# Patient Record
Sex: Female | Born: 1965 | Race: White | Hispanic: No | Smoking: Former smoker
Health system: Southern US, Community
[De-identification: ages and names within clinical notes are randomized; demographics above are authoritative.]

## PROBLEM LIST (undated history)

## (undated) DIAGNOSIS — B977 Papillomavirus as the cause of diseases classified elsewhere: Secondary | ICD-10-CM

## (undated) DIAGNOSIS — M1990 Unspecified osteoarthritis, unspecified site: Secondary | ICD-10-CM

## (undated) DIAGNOSIS — K589 Irritable bowel syndrome without diarrhea: Secondary | ICD-10-CM

## (undated) DIAGNOSIS — F32A Depression, unspecified: Secondary | ICD-10-CM

## (undated) DIAGNOSIS — F419 Anxiety disorder, unspecified: Secondary | ICD-10-CM

## (undated) DIAGNOSIS — C539 Malignant neoplasm of cervix uteri, unspecified: Secondary | ICD-10-CM

## (undated) DIAGNOSIS — D649 Anemia, unspecified: Secondary | ICD-10-CM

## (undated) DIAGNOSIS — K219 Gastro-esophageal reflux disease without esophagitis: Secondary | ICD-10-CM

## (undated) DIAGNOSIS — F329 Major depressive disorder, single episode, unspecified: Secondary | ICD-10-CM

## (undated) DIAGNOSIS — K602 Anal fissure, unspecified: Secondary | ICD-10-CM

## (undated) HISTORY — PX: INCONTINENCE SURGERY: SHX676

## (undated) HISTORY — DX: Irritable bowel syndrome, unspecified: K58.9

## (undated) HISTORY — PX: HEEL SPUR EXCISION: SHX1733

## (undated) HISTORY — PX: EXCISION MORTON'S NEUROMA: SHX5013

## (undated) HISTORY — PX: FOOT SURGERY: SHX648

## (undated) HISTORY — DX: Papillomavirus as the cause of diseases classified elsewhere: B97.7

## (undated) HISTORY — PX: RHINOPLASTY: SUR1284

## (undated) HISTORY — PX: TAYLOR BUNIONECTOMY: SHX2485

## (undated) HISTORY — PX: WISDOM TOOTH EXTRACTION: SHX21

## (undated) HISTORY — DX: Malignant neoplasm of cervix uteri, unspecified: C53.9

## (undated) HISTORY — PX: INGUINAL HERNIA REPAIR: SUR1180

## (undated) HISTORY — DX: Anal fissure, unspecified: K60.2

## (undated) HISTORY — PX: HERNIA REPAIR: SHX51

---

## 1997-09-08 ENCOUNTER — Other Ambulatory Visit: Admission: RE | Admit: 1997-09-08 | Discharge: 1997-09-08 | Payer: Self-pay | Admitting: *Deleted

## 1998-04-24 ENCOUNTER — Emergency Department (HOSPITAL_COMMUNITY): Admission: EM | Admit: 1998-04-24 | Discharge: 1998-04-24 | Payer: Self-pay | Admitting: Emergency Medicine

## 1998-04-24 ENCOUNTER — Encounter: Payer: Self-pay | Admitting: Emergency Medicine

## 1998-09-27 ENCOUNTER — Other Ambulatory Visit: Admission: RE | Admit: 1998-09-27 | Discharge: 1998-09-27 | Payer: Self-pay | Admitting: Obstetrics and Gynecology

## 1999-10-26 ENCOUNTER — Other Ambulatory Visit: Admission: RE | Admit: 1999-10-26 | Discharge: 1999-10-26 | Payer: Self-pay | Admitting: *Deleted

## 2000-11-04 ENCOUNTER — Other Ambulatory Visit: Admission: RE | Admit: 2000-11-04 | Discharge: 2000-11-04 | Payer: Self-pay | Admitting: Obstetrics and Gynecology

## 2001-07-11 ENCOUNTER — Emergency Department (HOSPITAL_COMMUNITY): Admission: EM | Admit: 2001-07-11 | Discharge: 2001-07-12 | Payer: Self-pay | Admitting: *Deleted

## 2001-07-12 ENCOUNTER — Encounter: Payer: Self-pay | Admitting: *Deleted

## 2002-05-19 ENCOUNTER — Encounter: Admission: RE | Admit: 2002-05-19 | Discharge: 2002-05-19 | Payer: Self-pay | Admitting: Internal Medicine

## 2002-05-19 ENCOUNTER — Encounter: Payer: Self-pay | Admitting: Internal Medicine

## 2002-08-06 ENCOUNTER — Other Ambulatory Visit: Admission: RE | Admit: 2002-08-06 | Discharge: 2002-08-06 | Payer: Self-pay | Admitting: Obstetrics and Gynecology

## 2006-10-03 ENCOUNTER — Emergency Department (HOSPITAL_COMMUNITY): Admission: EM | Admit: 2006-10-03 | Discharge: 2006-10-03 | Payer: Self-pay | Admitting: Emergency Medicine

## 2009-10-28 ENCOUNTER — Ambulatory Visit (HOSPITAL_COMMUNITY): Admission: RE | Admit: 2009-10-28 | Discharge: 2009-10-28 | Payer: Self-pay | Admitting: Obstetrics and Gynecology

## 2010-04-23 HISTORY — PX: ENDOMETRIAL ABLATION: SHX621

## 2010-04-23 HISTORY — PX: OTHER SURGICAL HISTORY: SHX169

## 2010-07-09 LAB — CBC
HCT: 36.1 % (ref 36.0–46.0)
Hemoglobin: 12.5 g/dL (ref 12.0–15.0)
MCH: 31.4 pg (ref 26.0–34.0)
MCHC: 34.7 g/dL (ref 30.0–36.0)
MCV: 90.5 fL (ref 78.0–100.0)
Platelets: 242 10*3/uL (ref 150–400)
RBC: 3.99 MIL/uL (ref 3.87–5.11)
RDW: 12.8 % (ref 11.5–15.5)
WBC: 3.8 10*3/uL — ABNORMAL LOW (ref 4.0–10.5)

## 2010-07-09 LAB — SURGICAL PCR SCREEN
MRSA, PCR: NEGATIVE
Staphylococcus aureus: NEGATIVE

## 2010-07-09 LAB — HCG, SERUM, QUALITATIVE: Preg, Serum: NEGATIVE

## 2011-02-08 LAB — CBC
HCT: 40.5
Platelets: 299
RDW: 12.8
WBC: 4.3

## 2011-02-08 LAB — DIFFERENTIAL
Basophils Absolute: 0
Basophils Relative: 0
Eosinophils Absolute: 0
Eosinophils Relative: 0
Monocytes Absolute: 0.3
Monocytes Relative: 8

## 2011-02-08 LAB — COMPREHENSIVE METABOLIC PANEL
ALT: 16
AST: 23
Albumin: 3.9
Alkaline Phosphatase: 56
Chloride: 98
Potassium: 2.9 — ABNORMAL LOW
Sodium: 131 — ABNORMAL LOW
Total Bilirubin: 1
Total Protein: 7

## 2011-02-08 LAB — URINALYSIS, ROUTINE W REFLEX MICROSCOPIC
Bilirubin Urine: NEGATIVE
Ketones, ur: NEGATIVE
Nitrite: NEGATIVE
Specific Gravity, Urine: 1.015
Urobilinogen, UA: 0.2

## 2011-02-08 LAB — URINE MICROSCOPIC-ADD ON

## 2011-02-08 LAB — POCT PREGNANCY, URINE
Operator id: 265961
Preg Test, Ur: NEGATIVE

## 2011-06-28 ENCOUNTER — Emergency Department (HOSPITAL_COMMUNITY): Payer: BC Managed Care – PPO

## 2011-06-28 ENCOUNTER — Emergency Department (HOSPITAL_COMMUNITY)
Admission: EM | Admit: 2011-06-28 | Discharge: 2011-06-28 | Disposition: A | Discharge status: Home / Self Care | Payer: BC Managed Care – PPO | Attending: Emergency Medicine | Admitting: Emergency Medicine

## 2011-06-28 ENCOUNTER — Encounter (HOSPITAL_COMMUNITY): Payer: Self-pay | Admitting: *Deleted

## 2011-06-28 DIAGNOSIS — M7989 Other specified soft tissue disorders: Secondary | ICD-10-CM | POA: Insufficient documentation

## 2011-06-28 DIAGNOSIS — W230XXA Caught, crushed, jammed, or pinched between moving objects, initial encounter: Secondary | ICD-10-CM | POA: Insufficient documentation

## 2011-06-28 DIAGNOSIS — L03019 Cellulitis of unspecified finger: Secondary | ICD-10-CM | POA: Insufficient documentation

## 2011-06-28 DIAGNOSIS — S6010XA Contusion of unspecified finger with damage to nail, initial encounter: Secondary | ICD-10-CM

## 2011-06-28 DIAGNOSIS — S6990XA Unspecified injury of unspecified wrist, hand and finger(s), initial encounter: Secondary | ICD-10-CM | POA: Insufficient documentation

## 2011-06-28 DIAGNOSIS — M25549 Pain in joints of unspecified hand: Secondary | ICD-10-CM | POA: Insufficient documentation

## 2011-06-28 DIAGNOSIS — S6000XA Contusion of unspecified finger without damage to nail, initial encounter: Secondary | ICD-10-CM | POA: Insufficient documentation

## 2011-06-28 DIAGNOSIS — IMO0002 Reserved for concepts with insufficient information to code with codable children: Secondary | ICD-10-CM

## 2011-06-28 DIAGNOSIS — M79609 Pain in unspecified limb: Secondary | ICD-10-CM | POA: Insufficient documentation

## 2011-06-28 MED ORDER — SULFAMETHOXAZOLE-TRIMETHOPRIM 800-160 MG PO TABS: 1.0000 | ORAL_TABLET | Freq: Two times a day (BID) | ORAL | Status: AC

## 2011-06-28 MED ORDER — HYDROCODONE-ACETAMINOPHEN 5-325 MG PO TABS: 1.0000 | ORAL_TABLET | ORAL | Status: AC | PRN

## 2011-06-28 NOTE — ED Provider Notes (Signed)
History     CSN: [id number]  Arrival date & time 06/28/11  1842   First MD Initiated Contact with Patient 06/28/11 1944      Chief Complaint  Patient presents with  . Hand Pain    (Consider location/radiation/quality/duration/timing/severity/associated sxs/prior treatment) Patient is a 46 y.o. female presenting with hand pain. The history is provided by the patient.  Hand Pain This is a new problem. Episode onset: 2 days ago. The problem occurs constantly. The problem has been gradually worsening. Associated symptoms include arthralgias. Pertinent negatives include no chills, fever, myalgias, nausea, numbness or weakness. The symptoms are aggravated by bending. She has tried NSAIDs and ice for the symptoms. The treatment provided mild relief.   Pt states she was unloading heavy sample boxes weighing 50+ lbs from her car two days ago when the box apparently closed on her bilateral fingertips, injuring the left index, right index, and right middle finger. Has noted drainage to the two nails on the right hand and has had increased bruising and pressure sensation to the tip of the left index finger. Denies numbness, weakness, loss of ROM. Has not tried warm soaks, ice. She went to urgent care today and was instructed to come to the ED for further eval.  History reviewed. No pertinent past medical history.  Past Surgical History  Procedure Date  . Hernia repair   . Foot surgery   . Incontinence surgery   . Rhinoplasty     No family history on file.  History  Substance Use Topics  . Smoking status: Never Smoker   . Smokeless tobacco: Not on file  . Alcohol Use: No    OB History    Grav Para Term Preterm Abortions TAB SAB Ect Mult Living   1         1      Review of Systems  Constitutional: Negative.  Negative for fever and chills.  Gastrointestinal: Negative for nausea.  Musculoskeletal: Positive for arthralgias. Negative for myalgias.  Skin: Positive for color change and  wound.  Neurological: Negative for weakness and numbness.    Allergies  Anti-inflammatory enzyme; Dust mite extract; Molds & smuts; and Pollen extract  Home Medications   Current Outpatient Rx  Name Route Sig Dispense Refill  . ACETAMINOPHEN 500 MG PO TABS Oral Take 1,000 mg by mouth every 6 (six) hours as needed. For pain    . CELECOXIB 200 MG PO CAPS Oral Take 200 mg by mouth daily.    . CHLORZOXAZONE 500 MG PO TABS Oral Take 500 mg by mouth as needed. For muscle pain.    Marland Kitchen RANITIDINE HCL 150 MG PO TABS Oral Take 150 mg by mouth daily.    . SERTRALINE HCL 100 MG PO TABS Oral Take 100 mg by mouth daily.    . TRAZODONE HCL 50 MG PO TABS Oral Take 75 mg by mouth at bedtime.      BP 124/74  Pulse 68  Temp(Src) 98.1 F (36.7 C) (Oral)  Resp 18  SpO2 98%  LMP 05/29/2011  Physical Exam  Nursing note and vitals reviewed. Constitutional: She appears well-developed and well-nourished. No distress.  HENT:  Head: Normocephalic and atraumatic.  Cardiovascular: Normal rate.   Pulmonary/Chest: Effort normal.  Musculoskeletal:       Right hand: She exhibits tenderness and swelling. She exhibits normal range of motion, normal capillary refill, no deformity and no laceration. normal sensation noted. Decreased sensation is not present in the ulnar distribution, is not  present in the medial distribution and is not present in the radial distribution. Normal strength noted. She exhibits no finger abduction, no thumb/finger opposition and no wrist extension trouble.       Left hand: She exhibits tenderness and swelling. She exhibits normal range of motion, normal capillary refill, no deformity and no laceration. normal sensation noted. Decreased sensation is not present in the ulnar distribution, is not present in the medial redistribution and is not present in the radial distribution. Normal strength noted. She exhibits no finger abduction and no thumb/finger opposition.       Hands:      L index:  subungual hematoma covering >75% of nailbed, dark purple to black in color. Surrounding edema. Nail appears to remain adhered to bed at this time. No evidence for open fx. 5/5 strength on finger flex/ext. Neurovascularly intact with sensory intact to lt touch.  R index and middle: No subungual hematoma but there appears to be serosanguinous drainage from nail bed. Questionable early paronychias to both fingers. Nail appears to remain adhered to bed at this time. No evidence for open fx. 5/5 strength on finger flex/ext. Neurovascularly intact with sensory intact to lt touch.  Neurological: She is alert.  Skin: Skin is warm and dry. She is not diaphoretic.  Psychiatric: She has a normal mood and affect.    ED Course  Procedures (including critical care time)  Labs Reviewed - No data to display Dg Hand Complete Left  06/28/2011  *RADIOLOGY REPORT*  Clinical Data: Bruising and drainage of second finger following injury 2 days ago.  LEFT HAND - COMPLETE 3+ VIEW  Comparison: None.  Findings: The mineralization and alignment are normal.  There is no evidence of acute fracture or dislocation.  There may be mild swelling of the index finger.  No foreign body or soft tissue emphysema is evident.  There is no evidence of bone destruction.  IMPRESSION: No acute osseous findings or foreign body.  Original Report Authenticated By: Gerrianne Scale, M.D.   Dg Hand Complete Right  06/28/2011  *RADIOLOGY REPORT*  Clinical Data: Right hand injury 2 days ago with bruising and drainage from the second and third fingers.  RIGHT HAND - COMPLETE 3+ VIEW  Comparison: None.  Findings: The mineralization and alignment are normal.  There is no evidence of acute fracture or dislocation.  There may be mild soft tissue swelling in the index finger.  No foreign body or soft tissue emphysema is identified.  There is no evidence of bone destruction.  IMPRESSION: No acute osseous findings or foreign bodies demonstrated.  Original  Report Authenticated By: Gerrianne Scale, M.D.     1. Paronychia   2. Subungual hematoma, fingernail   3. Nail bed injury       MDM  Pt presents 2 days after her fingertips were crushed by a heavy box. Subungual hematoma to L index; given timing do not feel that attempting drainage would be beneficial at this time due to clotting. Given drainage to R index, middle fingers will cover with abx for ?paronychia - due to pt's MRSA hx, will cover with Bactrim. Pt encouraged to use hot soaks several times daily. Return precautions discussed.        Grant Fontana, [place] 06/29/11 [id number]

## 2011-06-28 NOTE — Discharge Instructions (Signed)
Your x-ray did not show any fractures. It is possible that the the injured nails may fall off. If this occurs, please keep the areas covered with a bandage to protect the nail bed. Use warm soaks with Epsom salts to the areas several times daily. Use the pain medicine as needed. Return to the ED for worsening condition.

## 2011-06-28 NOTE — ED Notes (Signed)
Pt reports her finger tips were closed in a 50lb sample bags two days ago.  Pt now reports pain to right pointer finger and middle finger. Pt also reports pain to left pointer finger. Pt reports pus coming out of fingertips on right side.  Pt left finger tip is swollen and nailbed is bruised.

## 2011-06-30 NOTE — ED Provider Notes (Signed)
Medical screening examination/treatment/procedure(s) were conducted as a shared visit with non-physician practitioner(s) and myself.  I personally evaluated the patient during the encounter  One fingertip has complete subungual hematoma with mild erythema/tender cuticle without fluctuance question early paronychia forming.  Hurman Horn, MD 06/30/11 2001

## 2011-09-25 ENCOUNTER — Encounter (HOSPITAL_COMMUNITY): Payer: Self-pay | Admitting: *Deleted

## 2011-10-15 ENCOUNTER — Encounter (HOSPITAL_COMMUNITY): Payer: Self-pay | Admitting: Pharmacy Technician

## 2011-10-26 ENCOUNTER — Other Ambulatory Visit: Payer: Self-pay | Admitting: Obstetrics and Gynecology

## 2011-10-28 NOTE — H&P (Signed)
NAMEMIDA, CORY             ACCOUNT NO.:  [id number]  MEDICAL RECORD NO.:  [id number]  LOCATION:  [facility]                         FACILITY:  [facility]  PHYSICIAN:  Lenoard Aden, M.D.DATE OF BIRTH:  [birth year]  DATE OF ADMISSION:  09/24/2011 DATE OF DISCHARGE:                             HISTORY & PHYSICAL   CHIEF COMPLAINT:  Refractory menorrhagia with secondary anemia.  HISTORY OF PRESENT ILLNESS:  She is a 46 year old white female, G2, P1, with aforementioned complaint, for a NovaSure endometrial ablation.  MEDICATIONS:  Celebrex, Zoloft, trazodone as needed, Allegra as needed, and Parafon Forte.  ALLERGIES:  She has allergies to environmental __________.  She denies domestic or physical violence.  FAMILY HISTORY:  Hypertension, skin cancer, diabetes, history of __________ x1, history of anterior colporrhaphy, history of TVT, history of liposuction, cryotherapy, rhinoplasty and bone spur removal.  PHYSICAL EXAMINATION:  GENERAL:  This is a well-developed, well- nourished white female in no acute distress. HEENT:  Normal. NECK:  Supple.  Full range of motion [QAMARKER ABDOMENS:  Soft __________nontender. PELVIC:  Anteflexed uterus.  No adnexal masses. EXTREMITIES:  There are no cords. NEUROLOGIC:  Nonfocal. SKIN:  Intact.  IMPRESSION:  Refractory menorrhagia with secondary anemia, for  NovaSure endometrial ablation.  PLAN:  Proceed with diagnostic hysteroscopy, D and C, NovaSure endometrial ablation.  Risks of anesthesia, infection, bleeding, injury to abdominal organs, need for repair is discussed.  Delayed versus immediate complications to include bowel and bladder injury noted, possible failure, risk of NovaSure noted.  The patient acknowledges and wishes to proceed.     Lenoard Aden, M.D.    RJT/MEDQ  D:  10/28/2011  T:  10/28/2011  Job:  [id number]

## 2011-10-29 ENCOUNTER — Ambulatory Visit (HOSPITAL_COMMUNITY)
Admission: RE | Admit: 2011-10-29 | Discharge: 2011-10-29 | Disposition: A | Discharge status: Home / Self Care | Payer: BC Managed Care – PPO | Source: Ambulatory Visit | Attending: Obstetrics and Gynecology | Admitting: Obstetrics and Gynecology

## 2011-10-29 ENCOUNTER — Encounter (HOSPITAL_COMMUNITY): Payer: Self-pay | Admitting: Anesthesiology

## 2011-10-29 ENCOUNTER — Encounter (HOSPITAL_COMMUNITY)
Admission: RE | Disposition: A | Discharge status: Home / Self Care | Payer: Self-pay | Source: Ambulatory Visit | Attending: Obstetrics and Gynecology

## 2011-10-29 ENCOUNTER — Ambulatory Visit (HOSPITAL_COMMUNITY): Payer: BC Managed Care – PPO | Admitting: Anesthesiology

## 2011-10-29 DIAGNOSIS — N840 Polyp of corpus uteri: Secondary | ICD-10-CM | POA: Insufficient documentation

## 2011-10-29 DIAGNOSIS — N924 Excessive bleeding in the premenopausal period: Secondary | ICD-10-CM

## 2011-10-29 DIAGNOSIS — N920 Excessive and frequent menstruation with regular cycle: Secondary | ICD-10-CM | POA: Insufficient documentation

## 2011-10-29 DIAGNOSIS — D500 Iron deficiency anemia secondary to blood loss (chronic): Secondary | ICD-10-CM | POA: Insufficient documentation

## 2011-10-29 HISTORY — DX: Major depressive disorder, single episode, unspecified: F32.9

## 2011-10-29 HISTORY — DX: Depression, unspecified: F32.A

## 2011-10-29 HISTORY — DX: Unspecified osteoarthritis, unspecified site: M19.90

## 2011-10-29 HISTORY — DX: Anemia, unspecified: D64.9

## 2011-10-29 HISTORY — DX: Gastro-esophageal reflux disease without esophagitis: K21.9

## 2011-10-29 LAB — CBC
Hemoglobin: 12.3 g/dL (ref 12.0–15.0)
MCH: 29.5 pg (ref 26.0–34.0)
MCHC: 32.4 g/dL (ref 30.0–36.0)
Platelets: 285 10*3/uL (ref 150–400)
RDW: 12.6 % (ref 11.5–15.5)

## 2011-10-29 LAB — HCG, SERUM, QUALITATIVE: Preg, Serum: NEGATIVE

## 2011-10-29 SURGERY — DILATATION & CURETTAGE/HYSTEROSCOPY WITH NOVASURE ABLATION
Anesthesia: General | Site: Uterus | Wound class: Clean Contaminated

## 2011-10-29 MED ORDER — LACTATED RINGERS IV SOLN
INTRAVENOUS | Status: DC
Start: 2011-10-29 — End: 2011-10-29
  Administered 2011-10-29 (×2): via INTRAVENOUS

## 2011-10-29 MED ORDER — BUPIVACAINE HCL (PF) 0.25 % IJ SOLN
INTRAMUSCULAR | Status: AC
  Filled 2011-10-29: qty 30

## 2011-10-29 MED ORDER — VASOPRESSIN 20 UNIT/ML IJ SOLN
INTRAMUSCULAR | Status: AC
  Filled 2011-10-29: qty 1

## 2011-10-29 MED ORDER — LIDOCAINE HCL (CARDIAC) 20 MG/ML IV SOLN
INTRAVENOUS | Status: DC | PRN
  Administered 2011-10-29: 100 mg via INTRAVENOUS

## 2011-10-29 MED ORDER — BUPIVACAINE HCL (PF) 0.25 % IJ SOLN
INTRAMUSCULAR | Status: DC | PRN
  Administered 2011-10-29: 20 mL

## 2011-10-29 MED ORDER — KETOROLAC TROMETHAMINE 30 MG/ML IJ SOLN
INTRAMUSCULAR | Status: DC | PRN
  Administered 2011-10-29: 30 mg via INTRAVENOUS

## 2011-10-29 MED ORDER — FENTANYL CITRATE 0.05 MG/ML IJ SOLN
INTRAMUSCULAR | Status: AC
  Administered 2011-10-29: 50 ug via INTRAVENOUS
  Filled 2011-10-29: qty 2

## 2011-10-29 MED ORDER — FENTANYL CITRATE 0.05 MG/ML IJ SOLN
INTRAMUSCULAR | Status: DC | PRN
  Administered 2011-10-29 (×3): 50 ug via INTRAVENOUS
  Administered 2011-10-29: 100 ug via INTRAVENOUS

## 2011-10-29 MED ORDER — CEFAZOLIN SODIUM-DEXTROSE 2-3 GM-% IV SOLR
2.0000 g | INTRAVENOUS | Status: AC
  Administered 2011-10-29: 2 g via INTRAVENOUS

## 2011-10-29 MED ORDER — PROPOFOL 10 MG/ML IV EMUL
INTRAVENOUS | Status: DC | PRN
  Administered 2011-10-29: 200 mg via INTRAVENOUS

## 2011-10-29 MED ORDER — LACTATED RINGERS IR SOLN
Status: DC | PRN
  Administered 2011-10-29: 1

## 2011-10-29 MED ORDER — FAMOTIDINE 20 MG PO TABS
20.0000 mg | ORAL_TABLET | Freq: Once | ORAL | Status: AC
  Administered 2011-10-29: 20 mg via ORAL

## 2011-10-29 MED ORDER — DEXAMETHASONE SODIUM PHOSPHATE 4 MG/ML IJ SOLN
INTRAMUSCULAR | Status: DC | PRN
  Administered 2011-10-29: 5 mg via INTRAVENOUS

## 2011-10-29 MED ORDER — FENTANYL CITRATE 0.05 MG/ML IJ SOLN
25.0000 ug | INTRAMUSCULAR | Status: DC | PRN
  Administered 2011-10-29 (×2): 50 ug via INTRAVENOUS

## 2011-10-29 MED ORDER — FAMOTIDINE 20 MG PO TABS
ORAL_TABLET | ORAL | Status: AC
  Administered 2011-10-29: 20 mg via ORAL
  Filled 2011-10-29: qty 1

## 2011-10-29 MED ORDER — OXYCODONE-ACETAMINOPHEN 5-325 MG PO TABS: 1.0000 | ORAL_TABLET | ORAL | Status: AC | PRN

## 2011-10-29 MED ORDER — ONDANSETRON HCL 4 MG/2ML IJ SOLN
INTRAMUSCULAR | Status: DC | PRN
  Administered 2011-10-29: 4 mg via INTRAVENOUS

## 2011-10-29 MED ORDER — MIDAZOLAM HCL 5 MG/5ML IJ SOLN
INTRAMUSCULAR | Status: DC | PRN
  Administered 2011-10-29: 2 mg via INTRAVENOUS

## 2011-10-29 SURGICAL SUPPLY — 11 items
ABLATOR ENDOMETRIAL BIPOLAR (ABLATOR) ×2 IMPLANT
CATH ROBINSON RED A/P 16FR (CATHETERS) ×2 IMPLANT
CLOTH BEACON ORANGE TIMEOUT ST (SAFETY) ×2 IMPLANT
CONTAINER PREFILL 10% NBF 60ML (FORM) ×2 IMPLANT
GLOVE BIO SURGEON STRL SZ7.5 (GLOVE) ×4 IMPLANT
GOWN PREVENTION PLUS LG XLONG (DISPOSABLE) ×2 IMPLANT
GOWN STRL REIN XL XLG (GOWN DISPOSABLE) ×2 IMPLANT
NS IRRIG 1000ML POUR BTL (IV SOLUTION) ×2 IMPLANT
PACK HYSTEROSCOPY LF (CUSTOM PROCEDURE TRAY) ×2 IMPLANT
PAD PREP 24X48 CUFFED NSTRL (MISCELLANEOUS) ×2 IMPLANT
TOWEL OR 17X24 6PK STRL BLUE (TOWEL DISPOSABLE) ×4 IMPLANT

## 2011-10-29 NOTE — Op Note (Signed)
10/29/2011  10:37 AM  PATIENT:  Rachel Bullock  46 y.o. female  PRE-OPERATIVE DIAGNOSIS:  Menorrhagia Secondary Anemia  POST-OPERATIVE DIAGNOSIS:  Menorrhagia  PROCEDURE:  Procedure(s): DILATATION & CURETTAGE/HYSTEROSCOPY WITH NOVASURE ABLATION Removal of endometrial polyp  SURGEON:  Surgeon(s): Lenoard Aden, MD  ASSISTANTS: none   ANESTHESIA:   local and general  ESTIMATED BLOOD LOSS: * No blood loss amount entered *   DRAINS: none   LOCAL MEDICATIONS USED:  MARCAINE     SPECIMEN:  Source of Specimen:  EMC with polyp  DISPOSITION OF SPECIMEN:  PATHOLOGY  COUNTS:  YES  DICTATION #: [id number]  PLAN OF CARE: DC home  PATIENT DISPOSITION:  PACU - hemodynamically stable.

## 2011-10-29 NOTE — Progress Notes (Signed)
Patient ID: Rachel Bullock, female   DOB: 02/22/1966, 46 y.o.   MRN: [id number] Patient seen and examined. Consent witnessed and signed. No changes noted. Update completed.

## 2011-10-29 NOTE — Op Note (Signed)
NAMEKRYSTENA, Rachel Bullock             ACCOUNT NO.:  [id number]  MEDICAL RECORD NO.:  [id number]  LOCATION:  [facility]                          FACILITY:  [facility]  PHYSICIAN:  Lenoard Aden, M.D.DATE OF BIRTH:  1965/10/11  DATE OF PROCEDURE: DATE OF DISCHARGE:  10/29/2011                              OPERATIVE REPORT   DESCRIPTION OF PROCEDURE:  After being apprised of risks of anesthesia, infection, bleeding, uterine perforation, possible need for repair, inability to cure all vaginal bleeding, possible need for blood transfusion with secondary risks, the patient was brought to the operating room where she was administered a general anesthetic without complications.  Feet were placed in Yellofin stirrups.  She was prepped and draped in usual sterile fashion, catheterized, the bladder was empty.  Exam under anesthesia revealed a normal sized anteflexed uterus and no adnexal masses.  At this time, a dilute Marcaine solution was placed in standard paracervical block 20 mL total and the cervix was easily dilated up to a #25 Pratt dilator.  Visualization revealed a posterior wall endometrial polyp, which was removed using polyp forceps and sharp curettage.  Repeat visualization of the cavity to be empty. At this time, NovaSure measurements were taken after D and C was performed in a 4-quadrant method.  The NovaSure device was seated to a length of 5.5 and a width of 3.4.  The CO2 test performed was negative. The procedure was initiated in a standard fashion.  At the end of the procedure, the instrument was removed, inspected, and found to be intact.  Revisualization of the endometrial cavity reveals no evidence of uterine perforation, a well-ablated endometrial cavity.  The patient tolerated the procedure well.  Fluid deficit of 45 mL noted.  She was awakened and transferred to recovery in good condition.     Lenoard Aden, M.D.     RJT/MEDQ  D:  10/29/2011  T:  10/29/2011  Job:   [id number]

## 2011-10-29 NOTE — Anesthesia Postprocedure Evaluation (Signed)
  Anesthesia Post-op Note  Patient: [name]  Procedure(s) Performed: Procedure(s) (LRB): DILATATION & CURETTAGE/HYSTEROSCOPY WITH NOVASURE ABLATION (N/A)  Patient is awake and responsive. Pain and nausea are reasonably well controlled. Vital signs are stable and clinically acceptable. Oxygen saturation is clinically acceptable. There are no apparent anesthetic complications at this time. Patient is ready for discharge.

## 2011-10-29 NOTE — Transfer of Care (Signed)
Immediate Anesthesia Transfer of Care Note  Patient: Rachel Bullock  Procedure(s) Performed: Procedure(s) (LRB): DILATATION & CURETTAGE/HYSTEROSCOPY WITH NOVASURE ABLATION (N/A)  Patient Location: PACU  Anesthesia Type: General  Level of Consciousness: awake, alert  and oriented  Airway & Oxygen Therapy: Patient Spontanous Breathing and Patient connected to nasal cannula oxygen  Post-op Assessment: Report given to PACU RN and Post -op Vital signs reviewed and stable  Post vital signs: stable  Complications: No apparent anesthesia complications

## 2011-10-29 NOTE — Anesthesia Preprocedure Evaluation (Signed)
Anesthesia Evaluation    Airway       Dental   Pulmonary          Cardiovascular     Neuro/Psych    GI/Hepatic GERD-  ,  Endo/Other    Renal/GU      Musculoskeletal   Abdominal   Peds  Hematology   Anesthesia Other Findings   Reproductive/Obstetrics                           Anesthesia Physical Anesthesia Plan  ASA: II  Anesthesia Plan: General   Post-op Pain Management:    Induction: Intravenous  Airway Management Planned: LMA  Additional Equipment:   Intra-op Plan:   Post-operative Plan:   Informed Consent: I have reviewed the patients History and Physical, chart, labs and discussed the procedure including the risks, benefits and alternatives for the proposed anesthesia with the patient or authorized representative who has indicated his/her understanding and acceptance.   Dental Advisory Given  Plan Discussed with: CRNA and Surgeon  Anesthesia Plan Comments: (  Discussed  general anesthesia, including possible nausea, instrumentation of airway, sore throat,pulmonary aspiration, etc. I asked if the were any outstanding questions, or  concerns before we proceeded. )        Anesthesia Quick Evaluation

## 2013-02-03 ENCOUNTER — Ambulatory Visit (INDEPENDENT_AMBULATORY_CARE_PROVIDER_SITE_OTHER): Payer: BC Managed Care – PPO | Admitting: Nurse Practitioner

## 2013-02-03 ENCOUNTER — Encounter: Payer: Self-pay | Admitting: Nurse Practitioner

## 2013-02-03 VITALS — BP 108/70 | Ht 63.5 in | Wt 146.0 lb

## 2013-02-03 DIAGNOSIS — G43009 Migraine without aura, not intractable, without status migrainosus: Secondary | ICD-10-CM

## 2013-02-03 DIAGNOSIS — N951 Menopausal and female climacteric states: Secondary | ICD-10-CM

## 2013-02-03 MED ORDER — TOPIRAMATE 25 MG PO TABS: 25.0000 mg | ORAL_TABLET | Freq: Three times a day (TID) | ORAL | Status: DC

## 2013-02-03 MED ORDER — SUMATRIPTAN SUCCINATE 100 MG PO TABS: 100.0000 mg | ORAL_TABLET | Freq: Once | ORAL | Status: DC | PRN

## 2013-02-03 NOTE — Patient Instructions (Signed)
Migraine Headache A migraine headache is an intense, throbbing pain on one or both sides of your head. A migraine can last for 30 minutes to several hours. CAUSES  The exact cause of a migraine headache is not always known. However, a migraine may be caused when nerves in the brain become irritated and release chemicals that cause inflammation. This causes pain. SYMPTOMS  Pain on one or both sides of your head.  Pulsating or throbbing pain.  Severe pain that prevents daily activities.  Pain that is aggravated by any physical activity.  Nausea, vomiting, or both.  Dizziness.  Pain with exposure to bright lights, loud noises, or activity.  General sensitivity to bright lights, loud noises, or smells. Before you get a migraine, you may get warning signs that a migraine is coming (aura). An aura may include:  Seeing flashing lights.  Seeing bright spots, halos, or zig-zag lines.  Having tunnel vision or blurred vision.  Having feelings of numbness or tingling.  Having trouble talking.  Having muscle weakness. MIGRAINE TRIGGERS  Alcohol.  Smoking.  Stress.  Menstruation.  Aged cheeses.  Foods or drinks that contain nitrates, glutamate, aspartame, or tyramine.  Lack of sleep.  Chocolate.  Caffeine.  Hunger.  Physical exertion.  Fatigue.  Medicines used to treat chest pain (nitroglycerine), birth control pills, estrogen, and some blood pressure medicines. DIAGNOSIS  A migraine headache is often diagnosed based on:  Symptoms.  Physical examination.  A CT scan or MRI of your head. TREATMENT Medicines may be given for pain and nausea. Medicines can also be given to help prevent recurrent migraines.  HOME CARE INSTRUCTIONS  Only take over-the-counter or prescription medicines for pain or discomfort as directed by your caregiver. The use of long-term narcotics is not recommended.  Lie down in a dark, quiet room when you have a migraine.  Keep a journal  to find out what may trigger your migraine headaches. For example, write down:  What you eat and drink.  How much sleep you get.  Any change to your diet or medicines.  Limit alcohol consumption.  Quit smoking if you smoke.  Get 7 to 9 hours of sleep, or as recommended by your caregiver.  Limit stress.  Keep lights dim if bright lights bother you and make your migraines worse. SEEK IMMEDIATE MEDICAL CARE IF:   Your migraine becomes severe.  You have a fever.  You have a stiff neck.  You have vision loss.  You have muscular weakness or loss of muscle control.  You start losing your balance or have trouble walking.  You feel faint or pass out.  You have severe symptoms that are different from your first symptoms. MAKE SURE YOU:   Understand these instructions.  Will watch your condition.  Will get help right away if you are not doing well or get worse. Document Released: 04/09/2005 Document Revised: 07/02/2011 Document Reviewed: 03/30/2011 Exit[employer] Patient Information 2014 [employer].  

## 2013-02-03 NOTE — Progress Notes (Signed)
Diagnosis: Migraine without Aura  History: Rachel Bullock 47 y.o.G1P1 who presents to [facility] for consult on migraine headache. She was referred by Avel Sensor. Her migraines have been worse in the last 2 years and she feels like that is related to perimenopause. She had an ablation for bleeding that resulted in anemia. Her sister had menopause at 34 years old. She does not take any form of HRT. She at times has some difficulty with insomnia and uses Trazodone. 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     Location: [place]  Number of Headache days/month: can be daily or every other day Severe: 16 Moderate:16 Mild:0  Current Outpatient Prescriptions on File Prior to Visit  Medication Sig Dispense Refill  . acetaminophen (TYLENOL) 500 MG tablet Take 1,000 mg by mouth every 6 (six) hours as needed. For pain      . celecoxib (CELEBREX) 200 MG capsule Take 200 mg by mouth daily.      . ranitidine (ZANTAC) 150 MG tablet Take 150 mg by mouth daily.      . sertraline (ZOLOFT) 100 MG tablet Take 150 mg by mouth daily.       . traZODone (DESYREL) 50 MG tablet Take 75 mg by mouth at bedtime.       No current facility-administered medications on file prior to visit.    Acute / prevention: Tramadol  Past Medical History  Diagnosis Date  . SVD (spontaneous vaginal delivery)     x 1  . GERD (gastroesophageal reflux disease)   . Depression   . Anemia   . Arthritis     back and knee pain   Past Surgical History  Procedure Laterality Date  . Hernia repair    . Foot surgery    . Incontinence surgery    . Rhinoplasty    . Wisdom tooth extraction    . Uterine ablation  2012   History reviewed. No pertinent family history. Social History:  reports that she quit smoking about 22 years ago. Her smoking use included Cigarettes. She has a 2.5 pack-year smoking history. She has never used smokeless tobacco. She reports that she does not  drink alcohol or use illicit drugs. Allergies:  Allergies  Allergen Reactions  . Molds & Smuts Shortness Of Breath and Nausea And Vomiting  . Dust Mite Extract Itching  . Nutritional Supplements Other (See Comments)    Doubles over in pain. But can take Celebrex  . Pollen Extract     Triggers: Stress  Birth control: Ablation  ROS: Positive for migraine, night sweats, hot flashes, chronic back pain, allergies  Exam: 47 YO AA Female  General: NAD HEENT: Negative Cardiac:RRR Lungs:Clear Neuro:Negative Skin:Warm and dry  Impression:migraine - common  Plan: Discussed the pathophysiology of migraine. She is interested in prevention and we will start Topamax 25 mg for one week, 2 for one week and up to 3 tabs/ day. She will increase Celebrex to 200 mg daily. Start Imitrex for acute migraine.   Time Spent: 40 minutes

## 2013-02-04 LAB — FOLLICLE STIMULATING HORMONE: FSH: 15.2 m[IU]/mL

## 2013-02-09 ENCOUNTER — Telehealth: Payer: Self-pay | Admitting: *Deleted

## 2013-02-09 NOTE — Telephone Encounter (Signed)
Notified patient of lab results 

## 2013-02-09 NOTE — Telephone Encounter (Signed)
Message copied by Barbara Cower on Mon Feb 09, 2013  8:52 AM ------      Message from: BAREFOOT, [place] M      Created: Thu Feb 05, 2013 11:31 PM       Hi Inetta Fermo, can you call her and tell her she is not in menopause, likely perimenopause, thanks !!      ----- Message -----         From: [facility]         Sent: 02/04/2013   3:11 AM           To: Carolynn Serve, NP                   ------

## 2013-03-03 ENCOUNTER — Ambulatory Visit (INDEPENDENT_AMBULATORY_CARE_PROVIDER_SITE_OTHER): Payer: BC Managed Care – PPO | Admitting: Nurse Practitioner

## 2013-03-03 ENCOUNTER — Encounter: Payer: Self-pay | Admitting: Nurse Practitioner

## 2013-03-03 VITALS — BP 100/60 | HR 84 | Resp 16 | Ht 63.5 in | Wt 142.0 lb

## 2013-03-03 DIAGNOSIS — M542 Cervicalgia: Secondary | ICD-10-CM

## 2013-03-03 DIAGNOSIS — G43009 Migraine without aura, not intractable, without status migrainosus: Secondary | ICD-10-CM

## 2013-03-03 MED ORDER — LIDO-CAPSAICIN-MEN-METHYL SAL 0.5-0.035-5-20 % EX PTCH: 1.0000 | MEDICATED_PATCH | Freq: Two times a day (BID) | CUTANEOUS | Status: DC

## 2013-03-03 MED ORDER — TOPIRAMATE 200 MG PO TABS: 200.0000 mg | ORAL_TABLET | Freq: Every day | ORAL | Status: DC

## 2013-03-03 NOTE — Progress Notes (Signed)
History:  Rachel Bullock is a 47 y.o. G1P1 who presents to [place] [facility] today for follow up on migraines. She is now having some headache free days. She is up to Topamax 75 mg without any side effects. The Imitrex works well but she finds herself running short. We discussed her FSH. She is having a lot of left sided neck pain. The Celebrex 200 mg is really helping her neck pain.   The following portions of the patient's history were reviewed and updated as appropriate: allergies, current medications, past family history, past medical history, past social history, past surgical history and problem list.  Review of Systems:  Pertinent items are noted in HPI.  Objective:  Physical Exam BP 100/60  Pulse 84  Resp 16  Ht 5' 3.5" (1.613 m)  Wt 142 lb (64.411 kg)  BMI 24.76 kg/m2 GENERAL: Well-developed, well-nourished female in no acute distress.  HEENT: Normocephalic, atraumatic.  NECK: Left Trapezius and neck are very tight and tender   EXTREMITIES: No cyanosis, clubbing, or edema, 2+ distal pulses.  Trigger Point Injections  Procedure: 2cc lidocaine, 2 cc marcaine, 1 cc dexamethazone. Injected with 1cc each site in left trap, left [place]. Pt tolerated procedure well and felt less pain after   Labs and Imaging No results found.  Assessment & Plan:  Assessment:  Chronic Migraine Left neck pain  Plans:  Increase Topamax to 200 mg, titrate up as directed Rewrite Imitrex for # 20 Increase fluids and use Biotene for SE dryness from Trazodone/ Topamax Trigger Point Injections today Lidocaine patches on for 12 hours off for 12 hours Seek Botox approval RTC 6 weeks    Carolynn Serve, NP 03/03/2013 2:22 PM

## 2013-03-03 NOTE — Patient Instructions (Signed)
Migraine Headache A migraine headache is an intense, throbbing pain on one or both sides of your head. A migraine can last for 30 minutes to several hours. CAUSES  The exact cause of a migraine headache is not always known. However, a migraine may be caused when nerves in the brain become irritated and release chemicals that cause inflammation. This causes pain. SYMPTOMS  Pain on one or both sides of your head.  Pulsating or throbbing pain.  Severe pain that prevents daily activities.  Pain that is aggravated by any physical activity.  Nausea, vomiting, or both.  Dizziness.  Pain with exposure to bright lights, loud noises, or activity.  General sensitivity to bright lights, loud noises, or smells. Before you get a migraine, you may get warning signs that a migraine is coming (aura). An aura may include:  Seeing flashing lights.  Seeing bright spots, halos, or zig-zag lines.  Having tunnel vision or blurred vision.  Having feelings of numbness or tingling.  Having trouble talking.  Having muscle weakness. MIGRAINE TRIGGERS  Alcohol.  Smoking.  Stress.  Menstruation.  Aged cheeses.  Foods or drinks that contain nitrates, glutamate, aspartame, or tyramine.  Lack of sleep.  Chocolate.  Caffeine.  Hunger.  Physical exertion.  Fatigue.  Medicines used to treat chest pain (nitroglycerine), birth control pills, estrogen, and some blood pressure medicines. DIAGNOSIS  A migraine headache is often diagnosed based on:  Symptoms.  Physical examination.  A CT scan or MRI of your head. TREATMENT Medicines may be given for pain and nausea. Medicines can also be given to help prevent recurrent migraines.  HOME CARE INSTRUCTIONS  Only take over-the-counter or prescription medicines for pain or discomfort as directed by your caregiver. The use of long-term narcotics is not recommended.  Lie down in a dark, quiet room when you have a migraine.  Keep a journal  to find out what may trigger your migraine headaches. For example, write down:  What you eat and drink.  How much sleep you get.  Any change to your diet or medicines.  Limit alcohol consumption.  Quit smoking if you smoke.  Get 7 to 9 hours of sleep, or as recommended by your caregiver.  Limit stress.  Keep lights dim if bright lights bother you and make your migraines worse. SEEK IMMEDIATE MEDICAL CARE IF:   Your migraine becomes severe.  You have a fever.  You have a stiff neck.  You have vision loss.  You have muscular weakness or loss of muscle control.  You start losing your balance or have trouble walking.  You feel faint or pass out.  You have severe symptoms that are different from your first symptoms. MAKE SURE YOU:   Understand these instructions.  Will watch your condition.  Will get help right away if you are not doing well or get worse. Document Released: 04/09/2005 Document Revised: 07/02/2011 Document Reviewed: 03/30/2011 Exit[employer] Patient Information 2014 [employer].  

## 2014-02-22 ENCOUNTER — Encounter: Payer: Self-pay | Admitting: Nurse Practitioner

## 2014-07-13 ENCOUNTER — Encounter (HOSPITAL_COMMUNITY): Payer: Self-pay | Admitting: *Deleted

## 2014-07-13 ENCOUNTER — Emergency Department (HOSPITAL_COMMUNITY): Payer: 59

## 2014-07-13 ENCOUNTER — Ambulatory Visit (INDEPENDENT_AMBULATORY_CARE_PROVIDER_SITE_OTHER): Payer: 59 | Admitting: Family Medicine

## 2014-07-13 ENCOUNTER — Emergency Department (HOSPITAL_COMMUNITY)
Admission: EM | Admit: 2014-07-13 | Discharge: 2014-07-13 | Disposition: A | Discharge status: Home / Self Care | Payer: 59 | Attending: Emergency Medicine | Admitting: Emergency Medicine

## 2014-07-13 VITALS — BP 116/72 | HR 101 | Temp 98.1°F | Resp 16 | Ht 62.75 in | Wt 145.4 lb

## 2014-07-13 DIAGNOSIS — R42 Dizziness and giddiness: Secondary | ICD-10-CM | POA: Diagnosis not present

## 2014-07-13 DIAGNOSIS — F329 Major depressive disorder, single episode, unspecified: Secondary | ICD-10-CM | POA: Insufficient documentation

## 2014-07-13 DIAGNOSIS — G44311 Acute post-traumatic headache, intractable: Secondary | ICD-10-CM | POA: Diagnosis not present

## 2014-07-13 DIAGNOSIS — Z87891 Personal history of nicotine dependence: Secondary | ICD-10-CM | POA: Diagnosis not present

## 2014-07-13 DIAGNOSIS — H539 Unspecified visual disturbance: Secondary | ICD-10-CM

## 2014-07-13 DIAGNOSIS — S069X9A Unspecified intracranial injury with loss of consciousness of unspecified duration, initial encounter: Secondary | ICD-10-CM | POA: Diagnosis not present

## 2014-07-13 DIAGNOSIS — S0990XA Unspecified injury of head, initial encounter: Secondary | ICD-10-CM | POA: Diagnosis not present

## 2014-07-13 DIAGNOSIS — Y999 Unspecified external cause status: Secondary | ICD-10-CM | POA: Insufficient documentation

## 2014-07-13 DIAGNOSIS — Y939 Activity, unspecified: Secondary | ICD-10-CM | POA: Insufficient documentation

## 2014-07-13 DIAGNOSIS — K219 Gastro-esophageal reflux disease without esophagitis: Secondary | ICD-10-CM | POA: Insufficient documentation

## 2014-07-13 DIAGNOSIS — R110 Nausea: Secondary | ICD-10-CM | POA: Diagnosis not present

## 2014-07-13 DIAGNOSIS — S069X1A Unspecified intracranial injury with loss of consciousness of 30 minutes or less, initial encounter: Secondary | ICD-10-CM

## 2014-07-13 DIAGNOSIS — Z862 Personal history of diseases of the blood and blood-forming organs and certain disorders involving the immune mechanism: Secondary | ICD-10-CM | POA: Insufficient documentation

## 2014-07-13 DIAGNOSIS — Y929 Unspecified place or not applicable: Secondary | ICD-10-CM | POA: Diagnosis not present

## 2014-07-13 DIAGNOSIS — W109XXA Fall (on) (from) unspecified stairs and steps, initial encounter: Secondary | ICD-10-CM | POA: Diagnosis not present

## 2014-07-13 DIAGNOSIS — Z79899 Other long term (current) drug therapy: Secondary | ICD-10-CM | POA: Diagnosis not present

## 2014-07-13 DIAGNOSIS — Z8739 Personal history of other diseases of the musculoskeletal system and connective tissue: Secondary | ICD-10-CM | POA: Diagnosis not present

## 2014-07-13 LAB — BASIC METABOLIC PANEL
ANION GAP: 9 (ref 5–15)
BUN: 13 mg/dL (ref 6–23)
CALCIUM: 8.4 mg/dL (ref 8.4–10.5)
CHLORIDE: 107 mmol/L (ref 96–112)
CO2: 26 mmol/L (ref 19–32)
CREATININE: 0.6 mg/dL (ref 0.50–1.10)
GFR calc non Af Amer: >90 mL/min (ref >90)
Glucose, Bld: 97 mg/dL (ref 70–99)
Potassium: 4.4 mmol/L (ref 3.5–5.1)
SODIUM: 142 mmol/L (ref 135–145)

## 2014-07-13 LAB — CBC WITH DIFFERENTIAL/PLATELET
Basophils Absolute: 0.1 10*3/uL (ref 0.0–0.1)
Basophils Relative: 1 % (ref 0–1)
Eosinophils Absolute: 0.1 10*3/uL (ref 0.0–0.7)
Eosinophils Relative: 2 % (ref 0–5)
HCT: 37.7 % (ref 36.0–46.0)
Hemoglobin: 12.2 g/dL (ref 12.0–15.0)
LYMPHS ABS: 1.4 10*3/uL (ref 0.7–4.0)
Lymphocytes Relative: 37 % (ref 12–46)
MCH: 30.4 pg (ref 26.0–34.0)
MCHC: 32.4 g/dL (ref 30.0–36.0)
MCV: 94 fL (ref 78.0–100.0)
MONOS PCT: 9 % (ref 3–12)
Monocytes Absolute: 0.3 10*3/uL (ref 0.1–1.0)
NEUTROS ABS: 1.9 10*3/uL (ref 1.7–7.7)
NEUTROS PCT: 51 % (ref 43–77)
Platelets: 189 10*3/uL (ref 150–400)
RBC: 4.01 MIL/uL (ref 3.87–5.11)
RDW: 13.4 % (ref 11.5–15.5)
WBC: 3.7 10*3/uL — AB (ref 4.0–10.5)

## 2014-07-13 LAB — PROTIME-INR
INR: 0.88 (ref 0.00–1.49)
PROTHROMBIN TIME: 12.1 s (ref 11.6–15.2)

## 2014-07-13 LAB — ETHANOL: Alcohol, Ethyl (B): 205 mg/dL — ABNORMAL HIGH (ref 0–9)

## 2014-07-13 MED ORDER — METOCLOPRAMIDE HCL 5 MG/ML IJ SOLN
10.0000 mg | Freq: Once | INTRAMUSCULAR | Status: AC
  Administered 2014-07-13: 10 mg via INTRAVENOUS
  Filled 2014-07-13: qty 2

## 2014-07-13 MED ORDER — SODIUM CHLORIDE 0.9 % IV BOLUS (SEPSIS)
1000.0000 mL | Freq: Once | INTRAVENOUS | Status: AC
  Administered 2014-07-13: 1000 mL via INTRAVENOUS

## 2014-07-13 MED ORDER — DIPHENHYDRAMINE HCL 50 MG/ML IJ SOLN
25.0000 mg | Freq: Once | INTRAMUSCULAR | Status: AC
  Administered 2014-07-13: 25 mg via INTRAVENOUS
  Filled 2014-07-13: qty 1

## 2014-07-13 NOTE — Discharge Instructions (Signed)
Concussion A concussion is a brain injury. It is caused by:  A hit to the head.  A quick and sudden movement (jolt) of the head or neck. A concussion is usually not life threatening. Even so, it can cause serious problems. If you had a concussion before, you may have concussion-like problems after a hit to your head. HOME CARE General Instructions  Follow your doctor's directions carefully.  Take medicines only as told by your doctor.  Only take medicines your doctor says are safe.  Do not drink alcohol until your doctor says it is okay. Alcohol and some drugs can slow down healing. They can also put you at risk for further injury.  If you are having trouble remembering things, write them down.  Try to do one thing at a time if you get distracted easily. For example, do not watch TV while making dinner.  Talk to your family members or close friends when making important decisions.  Follow up with your doctor as told.  Watch your symptoms. Tell others to do the same. Serious problems can sometimes happen after a concussion. Older adults are more likely to have these problems.  Tell your teachers, school nurse, school counselor, coach, Product/process development scientist, or work Freight forwarder about your concussion. Tell them about what you can or cannot do. They should watch to see if:  It gets even harder for you to pay attention or concentrate.  It gets even harder for you to remember things or learn new things.  You need more time than normal to finish things.  You become annoyed (irritable) more than before.  You are not able to deal with stress as well.  You have more problems than before.  Rest. Make sure you:  Get plenty of sleep at night.  Go to sleep early.  Go to bed at the same time every day. Try to wake up at the same time.  Rest during the day.  Take naps when you feel tired.  Limit activities where you have to think a lot or concentrate. These include:  Doing  homework.  Doing work related to a job.  Watching TV.  Using the computer. Returning To Your Regular Activities Return to your normal activities slowly, not all at once. You must give your body and brain enough time to heal.   Do not play sports or do other athletic activities until your doctor says it is okay.  Ask your doctor when you can drive, ride a bicycle, or work other vehicles or machines. Never do these things if you feel dizzy.  Ask your doctor about when you can return to work or school. Preventing Another Concussion It is very important to avoid another brain injury, especially before you have healed. In rare cases, another injury can lead to permanent brain damage, brain swelling, or death. The risk of this is greatest during the first 7-10 days after your injury. Avoid injuries by:   Wearing a seat belt when riding in a car.  Not drinking too much alcohol.  Avoiding activities that could lead to a second concussion (such as contact sports).  Wearing a helmet when doing activities like:  Biking.  Skiing.  Skateboarding.  Skating.  Making your home safer by:  Removing things from the floor or stairways that could make you trip.  Using grab bars in bathrooms and handrails by stairs.  Placing non-slip mats on floors and in bathtubs.  Improve lighting in dark areas. GET HELP IF:  It  gets even harder for you to pay attention or concentrate.  It gets even harder for you to remember things or learn new things.  You need more time than normal to finish things.  You become annoyed (irritable) more than before.  You are not able to deal with stress as well.  You have more problems than before.  You have problems keeping your balance.  You are not able to react quickly when you should. Get help if you have any of these problems for more than 2 weeks:   Lasting (chronic) headaches.  Dizziness or trouble balancing.  Feeling sick to your stomach  (nausea).  Seeing (vision) problems.  Being affected by noises or light more than normal.  Feeling sad, low, down in the dumps, blue, gloomy, or empty (depressed).  Mood changes (mood swings).  Feeling of fear or nervousness about what may happen (anxiety).  Feeling annoyed.  Memory problems.  Problems concentrating or paying attention.  Sleep problems.  Feeling tired all the time. GET HELP RIGHT AWAY IF:   You have bad headaches or your headaches get worse.  You have weakness (even if it is in one hand, leg, or part of the face).  You have loss of feeling (numbness).  You feel off balance.  You keep throwing up (vomiting).  You feel tired.  One black center of your eye (pupil) is larger than the other.  You twitch or shake violently (convulse).  Your speech is not clear (slurred).  You are more confused, easily angered (agitated), or annoyed than before.  You have more trouble resting than before.  You are unable to recognize people or places.  You have neck pain.  It is difficult to wake you up.  You have unusual behavior changes.  You pass out (lose consciousness). MAKE SURE YOU:   Understand these instructions.  Will watch your condition.  Will get help right away if you are not doing well or get worse. Document Released: 03/28/2009 Document Revised: 08/24/2013 Document Reviewed: 10/30/2012 [facility] Patient Information 2015 [place], [place]. This information is not intended to replace advice given to you by your health care provider. Make sure you discuss any questions you have with your health care provider.

## 2014-07-13 NOTE — ED Provider Notes (Signed)
CSN: [id number]     Arrival date & time 07/13/14  2100 History   First MD Initiated Contact with Patient 07/13/14 2101     Chief Complaint  Patient presents with  . Fall  . Head Injury     (Consider location/radiation/quality/duration/timing/severity/associated sxs/prior Treatment) Patient is a 49 y.o. female presenting with fall and head injury. The history is provided by the patient.  Fall This is a new problem. The current episode started today. The problem occurs constantly. The problem has been gradually worsening. Associated symptoms include headaches. Pertinent negatives include no chest pain, coughing, fever, nausea, numbness, vomiting or weakness. Nothing aggravates the symptoms. She has tried nothing for the symptoms. The treatment provided no relief.  Head Injury Associated symptoms: headache   Associated symptoms: no nausea, no numbness and no vomiting     Past Medical History  Diagnosis Date  . SVD (spontaneous vaginal delivery)     x 1  . GERD (gastroesophageal reflux disease)   . Depression   . Anemia   . Arthritis     back and knee pain   Past Surgical History  Procedure Laterality Date  . Hernia repair    . Foot surgery    . Incontinence surgery    . Rhinoplasty    . Wisdom tooth extraction    . Uterine ablation  2012   History reviewed. No pertinent family history. History  Substance Use Topics  . Smoking status: Former Smoker -- 0.50 packs/day for 5 years    Types: Cigarettes    Quit date: 04/23/1990  . Smokeless tobacco: Never Used  . Alcohol Use: No   OB History    Gravida Para Term Preterm AB TAB SAB Ectopic Multiple Living   1         1     Review of Systems  Constitutional: Negative for fever.  Eyes: Positive for visual disturbance. Negative for photophobia and pain.  Respiratory: Negative for cough, chest tightness and shortness of breath.   Cardiovascular: Negative for chest pain.  Gastrointestinal: Negative for nausea and vomiting.   Neurological: Positive for headaches. Negative for weakness, light-headedness and numbness.  All other systems reviewed and are negative.     Allergies  Molds & smuts; Nutritional supplements; Dust mite extract; and Pollen extract  Home Medications   Prior to Admission medications   Medication Sig Start Date End Date Taking? Authorizing Provider  fexofenadine (ALLEGRA) 180 MG tablet Take 180 mg by mouth daily.   Yes Historical Provider, MD  ibuprofen (ADVIL,MOTRIN) 200 MG tablet Take 800 mg by mouth every 6 (six) hours as needed for headache.   Yes Historical Provider, MD  ranitidine (ZANTAC) 150 MG tablet Take 150 mg by mouth daily.   Yes Historical Provider, MD  sertraline (ZOLOFT) 100 MG tablet Take 150 mg by mouth daily.    Yes Historical Provider, MD  traZODone (DESYREL) 50 MG tablet Take 50 mg by mouth at bedtime.    Yes Historical Provider, MD   BP 127/79 mmHg  Pulse 75  Temp(Src) 98 F (36.7 C) (Oral)  Resp 20  SpO2 96% Physical Exam  Constitutional: She is oriented to person, place, and time. She appears well-developed and well-nourished. No distress.  HENT:  Head: Normocephalic and atraumatic.  Mouth/Throat: Oropharynx is clear and moist. No oropharyngeal exudate.  No obvious external signs of trauma.  Eyes: Conjunctivae and EOM are normal. Pupils are equal, round, and reactive to light.  Visual acuity 20/40 right eye 20/40  OS No papilledema OS  Neck: Normal range of motion. Neck supple.  No cervical spine tenderness  Cardiovascular: Normal rate, regular rhythm, normal heart sounds and intact distal pulses.  Exam reveals no gallop and no friction rub.   No murmur heard. Pulmonary/Chest: Effort normal and breath sounds normal. No respiratory distress. She has no wheezes. She has no rales.  Abdominal: Soft. She exhibits no distension. There is no tenderness.  Musculoskeletal: Normal range of motion. She exhibits no edema or tenderness.  Neurological: She is alert  and oriented to person, place, and time. She has normal strength. No cranial nerve deficit or sensory deficit. She displays a negative Romberg sign. Coordination and gait normal.  Skin: Skin is warm and dry. No rash noted. She is not diaphoretic.  Psychiatric: She has a normal mood and affect. Her behavior is normal. Judgment and thought content normal.  Nursing note and vitals reviewed.   ED Course  Procedures (including critical care time) Labs Review Labs Reviewed  CBC WITH DIFFERENTIAL/PLATELET - Abnormal; Notable for the following:    WBC 3.7 (*)    All other components within normal limits  ETHANOL - Abnormal; Notable for the following:    Alcohol, Ethyl (B) 205 (*)    All other components within normal limits  BASIC METABOLIC PANEL  PROTIME-INR    Imaging Review Ct Head Wo Contrast  07/13/2014   CLINICAL DATA:  Initial valuation for acute trauma, fall.  EXAM: CT HEAD WITHOUT CONTRAST  TECHNIQUE: Contiguous axial images were obtained from the base of the skull through the vertex without intravenous contrast.  COMPARISON:  None.  FINDINGS: There is no acute intracranial hemorrhage or infarct. No mass lesion or midline shift. Gray-white matter differentiation is well maintained. Ventricles are normal in size without evidence of hydrocephalus. CSF containing spaces are within normal limits. No extra-axial fluid collection.  The calvarium is intact.  Orbital soft tissues are within normal limits.  The paranasal sinuses and mastoid air cells are well pneumatized and free of fluid.  Scalp soft tissues are unremarkable.  IMPRESSION: Normal head CT with no acute intracranial abnormality identified.   Electronically Signed   By: Jeannine Boga M.D.   On: 07/13/2014 22:38     EKG Interpretation None      MDM   Final diagnoses:  Closed head injury, initial encounter    49 year old female with no significant past medical history presents with headache and floaters in left eye  after a fall. Fell down steps carrying a bag in the back of her head. Friends report approximately 30 minute loss of consciousness one hour after the fall. She presents with continued headache and concern for floaters. She has a strange affect and appears slightly intoxicated. States that she last had any alcohol at 4 PM, after the fall. Visual acuity is 20/40 OD and 20/40 OS. No corrective lenses used. Bedside ultrasound shows no retinal detachment. I will treat her headache and obtain CT scan of the head to rule out ICH. If she continues to floaters, would consider ophthalmology evaluation for retinal detachment, but given negative bedside US for retinal detachment and ongoing headache, feel it's likely related to head trauma. Basic labs including alcohol also sent for strange affect and history of head trauma.  Other labs reassuring. Alcohol at 200 which is consistent with exam. Husband is here and will take her home. He had with no acute intracranial abnormality. Floaters are gone and neurologic exam remains nonfocal after headache is resolved  with cocktail. Given the negative head CT, resolution of symptoms, and improved headache, will discharge to home with PCP follow-up for closed head injury. Return precautions for repeat floaters or visual changes discussed.  Larence Penning, MD 07/14/14 2395  Elnora Morrison, MD 07/14/14 [phone]

## 2014-07-13 NOTE — Progress Notes (Addendum)
Subjective:  This chart was scribed for Merri Ray, MD by Dellis Filbert, ED Scribe at [facility].The patient was seen in exam room 02 and the patient's care was started at 7:42 PM.   Patient ID: Rachel Bullock, female    DOB: 28-Mar-1966, 49 y.o.   MRN: [id number]   Chief Complaint  Patient presents with  . Fall    Hit Head on Concrete around 2 pm  . Dizziness  . Blurred Vision   HPI HPI Comments: Rachel Bullock is a 49 y.o. female with a history of migraine who presents to [facility] after she hit the left side of her head on concrete around 2:00 PM. She was on the way to go to work and tripped down the stairs. She initially denies LOC, but about one hour after the incident pt states passed out for about 30 min. This was unwitnessed. Pt states she did not fall asleep. Pt has a progressive left sided headache and ear pain. She has worsening dizziness, blurry vision and visual disturbance in her left eye, but not right. Pt says she there are "floaters" in her left eye.These symptoms began shortly after the fall. Pt also has difficulty speaking and slurred speech onset one hour ago. Pt also complains of worsening nausea since her fall, she has taken zantac for relief. She has not taken any blood thinners today. She has had 2 glasses of wine shortly after the fall. Pt states she typically drinks 2 glasses of wine 3-4 days a week. She denies vomiting.   Patient Active Problem List   Diagnosis Date Noted  . Neck pain on left side 03/03/2013  . Migraine without aura 02/03/2013   Past Medical History  Diagnosis Date  . SVD (spontaneous vaginal delivery)     x 1  . GERD (gastroesophageal reflux disease)   . Depression   . Anemia   . Arthritis     back and knee pain   Past Surgical History  Procedure Laterality Date  . Hernia repair    . Foot surgery    . Incontinence surgery    . Rhinoplasty    . Wisdom tooth extraction    . Uterine  ablation  2012   Allergies  Allergen Reactions  . Molds & Smuts Shortness Of Breath and Nausea And Vomiting  . Dust Mite Extract Itching  . Nutritional Supplements Other (See Comments)    Doubles over in pain. But can take Celebrex  . Pollen Extract    Prior to Admission medications   Medication Sig Start Date End Date Taking? Authorizing Provider  acetaminophen (TYLENOL) 500 MG tablet Take 1,000 mg by mouth every 6 (six) hours as needed. For pain   Yes Historical Provider, MD  fexofenadine (ALLEGRA) 180 MG tablet Take 180 mg by mouth daily.   Yes Historical Provider, MD  ranitidine (ZANTAC) 150 MG tablet Take 150 mg by mouth daily.   Yes Historical Provider, MD  sertraline (ZOLOFT) 100 MG tablet Take 150 mg by mouth daily.    Yes Historical Provider, MD  traZODone (DESYREL) 50 MG tablet Take 75 mg by mouth at bedtime.   Yes Historical Provider, MD  celecoxib (CELEBREX) 200 MG capsule Take 200 mg by mouth daily.    Historical Provider, MD  Lido-Capsaicin-Men-Methyl Sal 0.5-0.035-5-20 % PTCH Apply 1 patch topically every 12 (twelve) hours. Patient not taking: Reported on 07/13/2014 03/03/13   Olegario Messier, NP  SUMAtriptan (  IMITREX) 100 MG tablet Take 1 tablet (100 mg total) by mouth once as needed for migraine. May repeat in 2 hours if headache persists or recurs. Patient not taking: Reported on 07/13/2014 02/03/13   Olegario Messier, NP  topiramate (TOPAMAX) 200 MG tablet Take 1 tablet (200 mg total) by mouth daily. Patient not taking: Reported on 07/13/2014 03/03/13   Olegario Messier, NP   History   Social History  . Marital Status: Married    Spouse Name: N/A  . Number of Children: N/A  . Years of Education: N/A   Occupational History  . Not on file.   Social History Main Topics  . Smoking status: Former Smoker -- 0.50 packs/day for 5 years    Types: Cigarettes    Quit date: 04/23/1990  . Smokeless tobacco: Never Used  . Alcohol Use: No  . Drug Use: No  . Sexual  Activity: Yes    Birth Control/ Protection: Condom   Other Topics Concern  . Not on file   Social History Narrative   Review of Systems  HENT: Positive for ear pain.   Eyes: Positive for photophobia and visual disturbance.  Gastrointestinal: Positive for nausea. Negative for vomiting.  Neurological: Positive for dizziness, speech difficulty, light-headedness and headaches.       Objective:  BP 116/72 mmHg  Pulse 101  Temp(Src) 98.1 F (36.7 C) (Oral)  Resp 16  Ht 5' 2.75" (1.594 m)  Wt 145 lb 6 oz (65.942 kg)  BMI 25.95 kg/m2  SpO2 96% Physical Exam  Constitutional: She is oriented to person, place, and time. She appears well-developed and well-nourished. No distress.  HENT:  Head: Normocephalic and atraumatic. Head is without raccoon's eyes and without Battle's sign.  Right Ear: No hemotympanum.  Left Ear: No hemotympanum.  Soft tissue swelling in the on the left temple proximal to the right temple. The skin is intact.  Eyes: Conjunctivae and EOM are normal. Pupils are equal, round, and reactive to light.  No nystagmus.  Neck: Normal range of motion.  Cardiovascular: Normal rate and regular rhythm.   Pulmonary/Chest: Effort normal. No respiratory distress.  Musculoskeletal: Normal range of motion.  Neck and lower back are non tender.  Neurological: She is alert and oriented to person, place, and time. She has normal strength. No cranial nerve deficit or sensory deficit. GCS eye subscore is 4. GCS verbal subscore is 5. GCS motor subscore is 6.  Equal grip strength bilaterally. On history a few words were slightly slowed but overall normal.  Skin: Skin is warm and dry.  Psychiatric: She has a normal mood and affect. Her behavior is normal.  Nursing note and vitals reviewed.     Assessment & Plan:   7:56 PM-EMS was called approximately 19:52. Charge nurse at [facility] advised.   Rachel Bullock is a 49 y.o. female Head injury, closed, with brief  LOC  Dizziness  Visual disturbance  Nausea without vomiting  Intractable acute post-traumatic headache  Closed head injury at approximately 2pm over left temple, with subsequent dizziness, increasing headache, nausea without vomiting, L eye blurry vision and floaters, and reported approximately 23mins LOC.  Did admit to 2 alcoholic drinks after incident, but declined falling asleep. Reported slurred speech, but grossly speech intact on my evaluation and nonfocal exam.   -based on location and worsening symptoms, EMS called for transport to [facility] for further eval +/- neuroimaging. IV placed (20ga R hand).   -denies other injuries, specifically no new neck or  back pain.   8:33 PM: report given to EMS with transfer of care.   No orders of the defined types were placed in this encounter.   Patient Instructions  If follow up needed after your emergency department visit, return here if unable to see your primary care provider.    I personally performed the services described in this documentation, which was scribed in my presence. The recorded information has been reviewed and considered, and addended by me as needed.

## 2014-07-13 NOTE — ED Notes (Signed)
Patient transported to CT 

## 2014-07-13 NOTE — Patient Instructions (Addendum)
If follow up needed after your emergency department visit, return here if unable to see your primary care provider.

## 2014-07-13 NOTE — ED Notes (Addendum)
Per pt: pt left down some steps with a  50 lbs bag and hit the back of her head. Pt reports LOC for 30 minutes a hour after she fell. Pt also reports "floaters" in left eye. Pt reports tingling in left neck, shoulder, and arm. Pt A&Ox4, respirations equal and unlabored, skin warm and dry. Pt also reports some ETOH tonight

## 2016-04-30 ENCOUNTER — Ambulatory Visit: Payer: Self-pay

## 2018-01-17 ENCOUNTER — Encounter: Payer: Self-pay | Admitting: *Deleted

## 2018-01-17 DIAGNOSIS — F411 Generalized anxiety disorder: Secondary | ICD-10-CM | POA: Insufficient documentation

## 2018-01-17 DIAGNOSIS — F329 Major depressive disorder, single episode, unspecified: Secondary | ICD-10-CM | POA: Insufficient documentation

## 2018-03-18 ENCOUNTER — Other Ambulatory Visit: Payer: Self-pay

## 2018-03-18 MED ORDER — SERTRALINE HCL 100 MG PO TABS: 200.0000 mg | ORAL_TABLET | Freq: Every day | ORAL | 1 refills | Status: DC

## 2018-03-25 ENCOUNTER — Other Ambulatory Visit: Payer: Self-pay | Admitting: Psychiatry

## 2018-04-02 ENCOUNTER — Other Ambulatory Visit: Payer: Self-pay | Admitting: Psychiatry

## 2018-04-02 NOTE — Telephone Encounter (Signed)
Need to review paper chart  

## 2018-04-07 ENCOUNTER — Ambulatory Visit: Payer: Self-pay | Admitting: Psychiatry

## 2018-05-16 ENCOUNTER — Other Ambulatory Visit: Payer: Self-pay | Admitting: Psychiatry

## 2018-05-17 NOTE — Telephone Encounter (Signed)
Need to review paper chart  

## 2018-05-19 ENCOUNTER — Other Ambulatory Visit: Payer: Self-pay

## 2018-05-19 NOTE — Telephone Encounter (Signed)
Refill already submitted

## 2018-07-13 ENCOUNTER — Emergency Department (HOSPITAL_COMMUNITY)
Admission: EM | Admit: 2018-07-13 | Discharge: 2018-07-13 | Disposition: A | Discharge status: Home / Self Care | Payer: Self-pay | Attending: Emergency Medicine | Admitting: Emergency Medicine

## 2018-07-13 ENCOUNTER — Emergency Department (HOSPITAL_COMMUNITY): Payer: Self-pay

## 2018-07-13 ENCOUNTER — Encounter (HOSPITAL_COMMUNITY): Payer: Self-pay | Admitting: Obstetrics and Gynecology

## 2018-07-13 ENCOUNTER — Other Ambulatory Visit: Payer: Self-pay

## 2018-07-13 DIAGNOSIS — R05 Cough: Secondary | ICD-10-CM | POA: Insufficient documentation

## 2018-07-13 DIAGNOSIS — J019 Acute sinusitis, unspecified: Secondary | ICD-10-CM

## 2018-07-13 DIAGNOSIS — Z87891 Personal history of nicotine dependence: Secondary | ICD-10-CM | POA: Insufficient documentation

## 2018-07-13 DIAGNOSIS — J40 Bronchitis, not specified as acute or chronic: Secondary | ICD-10-CM

## 2018-07-13 DIAGNOSIS — Z79899 Other long term (current) drug therapy: Secondary | ICD-10-CM | POA: Insufficient documentation

## 2018-07-13 HISTORY — DX: Anxiety disorder, unspecified: F41.9

## 2018-07-13 LAB — CBC WITH DIFFERENTIAL/PLATELET
Abs Immature Granulocytes: 0.03 10*3/uL (ref 0.00–0.07)
BASOS PCT: 1 %
Basophils Absolute: 0 10*3/uL (ref 0.0–0.1)
Eosinophils Absolute: 0.1 10*3/uL (ref 0.0–0.5)
Eosinophils Relative: 1 %
HCT: 36.6 % (ref 36.0–46.0)
Hemoglobin: 11.8 g/dL — ABNORMAL LOW (ref 12.0–15.0)
Immature Granulocytes: 1 %
Lymphocytes Relative: 29 %
Lymphs Abs: 1.5 10*3/uL (ref 0.7–4.0)
MCH: 30.4 pg (ref 26.0–34.0)
MCHC: 32.2 g/dL (ref 30.0–36.0)
MCV: 94.3 fL (ref 80.0–100.0)
Monocytes Absolute: 0.4 10*3/uL (ref 0.1–1.0)
Monocytes Relative: 7 %
Neutro Abs: 3.2 10*3/uL (ref 1.7–7.7)
Neutrophils Relative %: 61 %
PLATELETS: 261 10*3/uL (ref 150–400)
RBC: 3.88 MIL/uL (ref 3.87–5.11)
RDW: 11.7 % (ref 11.5–15.5)
WBC: 5.2 10*3/uL (ref 4.0–10.5)
nRBC: 0 % (ref 0.0–0.2)

## 2018-07-13 LAB — BASIC METABOLIC PANEL
ANION GAP: 8 (ref 5–15)
BUN: 14 mg/dL (ref 6–20)
CO2: 25 mmol/L (ref 22–32)
Calcium: 8.6 mg/dL — ABNORMAL LOW (ref 8.9–10.3)
Chloride: 106 mmol/L (ref 98–111)
Creatinine, Ser: 0.63 mg/dL (ref 0.44–1.00)
GFR calc Af Amer: >60 mL/min (ref >60)
GFR calc non Af Amer: >60 mL/min (ref >60)
Glucose, Bld: 94 mg/dL (ref 70–99)
Potassium: 3.7 mmol/L (ref 3.5–5.1)
Sodium: 139 mmol/L (ref 135–145)

## 2018-07-13 MED ORDER — IPRATROPIUM-ALBUTEROL 0.5-2.5 (3) MG/3ML IN SOLN
3.0000 mL | Freq: Once | RESPIRATORY_TRACT | Status: AC
  Administered 2018-07-13: 3 mL via RESPIRATORY_TRACT
  Filled 2018-07-13: qty 3

## 2018-07-13 MED ORDER — ALBUTEROL SULFATE HFA 108 (90 BASE) MCG/ACT IN AERS
1.0000 | INHALATION_SPRAY | Freq: Once | RESPIRATORY_TRACT | Status: AC
  Administered 2018-07-13: 2 via RESPIRATORY_TRACT
  Filled 2018-07-13: qty 6.7

## 2018-07-13 MED ORDER — PREDNISONE 20 MG PO TABS
60.0000 mg | ORAL_TABLET | Freq: Once | ORAL | Status: AC
  Administered 2018-07-13: 60 mg via ORAL
  Filled 2018-07-13: qty 3

## 2018-07-13 MED ORDER — AMOXICILLIN-POT CLAVULANATE 875-125 MG PO TABS: 1.0000 | ORAL_TABLET | Freq: Two times a day (BID) | ORAL | 0 refills | Status: DC

## 2018-07-13 MED ORDER — PREDNISONE 20 MG PO TABS: 40.0000 mg | ORAL_TABLET | Freq: Every day | ORAL | 0 refills | Status: DC

## 2018-07-13 NOTE — ED Triage Notes (Signed)
Pt reports she works in [employer] and feels exceedingly short of breathe. Pt reports she "feels like she is drowning" Pt becomes SOB with walking and exertion.  Pt is crying and coughing at the same time.  Pt is unsure if she has been around anyone with Covid-19.  PT denies recent travel and denies close family having recently traveled

## 2018-07-13 NOTE — Discharge Instructions (Addendum)
Use inhaler as needed for shortness of breath/wheezing Take Prednisone for chest inflammation Take Augmentin for sinus infection Continue allergy medicine and nasal spray Return if worsening

## 2018-07-13 NOTE — ED Provider Notes (Signed)
[facility] DEPT Provider Note   CSN: [id number] Arrival date & time: 07/13/18  2140    History   Chief Complaint Chief Complaint  Patient presents with  . Shortness of Breath    HPI Rachel Bullock is a 53 y.o. female who presents with shortness of breath and cough.  Past medical history significant for anxiety, multiple allergies, recurrent sinusitis and bronchitis.  Patient states that she started to have nasal drainage and sinus congestion about 10 days ago. She states that she has had multiple sinus infections and this feels similar.  Symptoms have progressively worsened and she feels like it is gone into her chest.  She is unable to cough anything up and feels short of breath.  She has chest pain with coughing but denies any chest pain at rest or with exertion. She reports associated wheezing. She does not have inhalers. Earlier today she felt like she was "drowning" so she came to the ED.  She denies any known sick contacts and has not traveled.  She has been home because she worked in [employer] which have been shut down due to coronavirus. She denies fever, chills, body aches, swelling in the legs.  She also states that she has been having an issue with a "strep" infection of her skin which has been improving without intervention. She is a former smoker.    HPI  Past Medical History:  Diagnosis Date  . Anemia   . Anxiety   . Arthritis    back and knee pain  . Depression   . GERD (gastroesophageal reflux disease)   . SVD (spontaneous vaginal delivery)    x 1    Patient Active Problem List   Diagnosis Date Noted  . GAD (generalized anxiety disorder) 01/17/2018  . MDD (major depressive disorder) 01/17/2018  . Neck pain on left side 03/03/2013  . Migraine without aura 02/03/2013    Past Surgical History:  Procedure Laterality Date  . FOOT SURGERY    . HERNIA REPAIR    . INCONTINENCE SURGERY    . RHINOPLASTY    . uterine ablation   2012  . WISDOM TOOTH EXTRACTION       OB History    Gravida  1   Para      Term      Preterm      AB      Living  1     SAB      TAB      Ectopic      Multiple      Live Births               Home Medications    Prior to Admission medications   Medication Sig Start Date End Date Taking? Authorizing Provider  ALPRAZolam Duanne Moron) 0.25 MG tablet Take 0.25 mg by mouth at bedtime as needed for anxiety.    [provider]  ALPRAZolam Duanne Moron) 0.5 MG tablet TAKE 1 TABLET BY MOUTH DAILY AT BEDTIME AND ONE EVERY DAY AS NEEDED FOR ANXIETY 03/26/18   Cottle, Billey Co., MD  busPIRone (BUSPAR) 30 MG tablet Take 1 tablet (30 mg total) by mouth 2 (two) times daily. 04/03/18   Cottle, Billey Co., MD  fexofenadine (ALLEGRA) 180 MG tablet Take 180 mg by mouth daily.    [provider]  ibuprofen (ADVIL,MOTRIN) 200 MG tablet Take 800 mg by mouth every 6 (six) hours as needed for headache.    [provider]  ranitidine (ZANTAC) 150 MG tablet Take 150 mg by mouth daily.    [provider]  sertraline (ZOLOFT) 100 MG tablet TAKE TWO TABLETS BY MOUTH DAILY  05/19/18   Cottle, Billey Co., MD  traZODone (DESYREL) 50 MG tablet Take 50 mg by mouth at bedtime.     [provider]    Family History No family history on file.  Social History Social History   Tobacco Use  . Smoking status: Former Smoker    Packs/day: 0.50    Years: 5.00    Pack years: 2.50    Types: Cigarettes    Last attempt to quit: 04/23/1990    Years since quitting: 28.2  . Smokeless tobacco: Never Used  Substance Use Topics  . Alcohol use: Yes    Alcohol/week: 5.0 standard drinks    Types: 5 Shots of liquor per week  . Drug use: No     Allergies   Molds & smuts; Nutritional supplements; Dust mite extract; and Pollen extract   Review of Systems Review of Systems  Constitutional: Negative for chills and fever.  HENT: Positive for congestion, rhinorrhea and  sore throat. Negative for ear pain.   Respiratory: Positive for cough, shortness of breath and wheezing.   Cardiovascular: Positive for chest pain (with coughing). Negative for palpitations and leg swelling.  Gastrointestinal: Negative for abdominal pain, nausea and vomiting.  All other systems reviewed and are negative.    Physical Exam Updated Vital Signs BP (!) 121/93 Comment: Simultaneous filing. User may not have seen previous data.  Pulse 90   Temp 99 F (37.2 C) (Oral)   Resp (!) 22   Ht 5' 3.5" (1.613 m)   Wt 70.3 kg   SpO2 97%   BMI 27.03 kg/m   Physical Exam Vitals signs and nursing note reviewed.  Constitutional:      General: She is in acute distress.     Appearance: She is well-developed. She is not ill-appearing.     Comments: Anxious, crying, coughing frequently  HENT:     Head: Normocephalic and atraumatic.     Right Ear: Tympanic membrane normal.     Left Ear: Tympanic membrane normal.     Nose: Rhinorrhea present.     Mouth/Throat:     Lips: Pink.     Mouth: Mucous membranes are moist.     Pharynx: Oropharynx is clear.  Eyes:     General: No scleral icterus.       Right eye: No discharge.        Left eye: No discharge.     Conjunctiva/sclera: Conjunctivae normal.     Pupils: Pupils are equal, round, and reactive to light.  Neck:     Musculoskeletal: Normal range of motion.  Cardiovascular:     Rate and Rhythm: Normal rate and regular rhythm.  Pulmonary:     Effort: Pulmonary effort is normal. No respiratory distress.     Breath sounds: Wheezing present.  Abdominal:     General: There is no distension.  Musculoskeletal:     Right lower leg: Edema present.     Left lower leg: Edema present.  Skin:    General: Skin is warm and dry.     Comments: 2 papules over the right lower leg behind the calf  Neurological:     Mental Status: She is alert and oriented to person, place, and time.  Psychiatric:        Behavior: Behavior normal.  ED  Treatments / Results  Labs (all labs ordered are listed, but only abnormal results are displayed) Labs Reviewed  BASIC METABOLIC PANEL - Abnormal; Notable for the following components:      Result Value   Calcium 8.6 (*)    All other components within normal limits  CBC WITH DIFFERENTIAL/PLATELET - Abnormal; Notable for the following components:   Hemoglobin 11.8 (*)    All other components within normal limits    EKG EKG Interpretation  Date/Time:  Sunday July 13 2018 22:09:22 EDT Ventricular Rate:  88 PR Interval:    QRS Duration: 82 QT Interval:  350 QTC Calculation: 424 R Axis:   31 Text Interpretation:  Sinus rhythm Abnormal R-wave progression, early transition No old tracing to compare Confirmed by Daleen Bo [phone]) on 07/13/2018 10:31:02 PM   Radiology Dg Chest 2 View  Result Date: 07/13/2018 CLINICAL DATA:  Shortness of breath. EXAM: CHEST - 2 VIEW COMPARISON:  None. FINDINGS: The cardiomediastinal contours are normal. Subsegmental atelectasis at the left lung base. Pulmonary vasculature is normal. No consolidation, pleural effusion, or pneumothorax. No acute osseous abnormalities are seen. IMPRESSION: Subsegmental left basilar atelectasis.  Otherwise clear lungs. Electronically Signed   By: Keith Rake M.D.   On: 07/13/2018 22:17    Procedures Procedures (including critical care time)  Medications Ordered in ED Medications  albuterol (PROVENTIL HFA;VENTOLIN HFA) 108 (90 Base) MCG/ACT inhaler 1-2 puff (has no administration in time range)  ipratropium-albuterol (DUONEB) 0.5-2.5 (3) MG/3ML nebulizer solution 3 mL (3 mLs Nebulization Given 07/13/18 2221)  predniSONE (DELTASONE) tablet 60 mg (60 mg Oral Given 07/13/18 2218)     Initial Impression / Assessment and Plan / ED Course  I have reviewed the triage vital signs and the nursing notes.  Pertinent labs & imaging results that were available during my care of the patient were reviewed by me and considered  in my medical decision making (see chart for details).  53 year old female with URI symptoms and now SOB and coughing for the past 10 days. She reports long hx of allergies and multiple sinus infections in the past which feel the same.  Vital signs are normal here.  Heart is regular rate and rhythm.  She has diffuse wheezing on exam and is frequently coughing.  Will obtain EKG, chest x-ray, labs.  Will give breathing treatment and reassess.  Low suspicion for PE or ACS as patient sound infectious versus allergic.  CBC is remarkable for mild anemia.  BMP is normal.  Chest x-ray is negative.  After breathing treatment she is feeling better.  Repeat lung exam is improved.  We will treat for sinusitis and bronchitis with Augmentin, prednisone, inhaler.  She has allergy medicine which she takes daily as well as a nasal steroid.  She was given return precautions  Final Clinical Impressions(s) / ED Diagnoses   Final diagnoses:  Acute sinusitis, recurrence not specified, unspecified location  Bronchitis    ED Discharge Orders    None       Recardo Evangelist, PA-C 07/13/18 2308    Daleen Bo, MD 07/17/18 1517

## 2018-07-16 ENCOUNTER — Other Ambulatory Visit: Payer: Self-pay | Admitting: Psychiatry

## 2018-07-16 NOTE — Telephone Encounter (Signed)
Paper chart needs to be pulled not seen in epic

## 2018-08-22 DIAGNOSIS — K589 Irritable bowel syndrome without diarrhea: Secondary | ICD-10-CM | POA: Insufficient documentation

## 2018-08-22 DIAGNOSIS — F32A Depression, unspecified: Secondary | ICD-10-CM | POA: Insufficient documentation

## 2018-08-22 DIAGNOSIS — G4700 Insomnia, unspecified: Secondary | ICD-10-CM | POA: Insufficient documentation

## 2018-11-08 ENCOUNTER — Other Ambulatory Visit: Payer: Self-pay | Admitting: Psychiatry

## 2018-12-01 ENCOUNTER — Other Ambulatory Visit: Payer: Self-pay | Admitting: Psychiatry

## 2018-12-01 ENCOUNTER — Other Ambulatory Visit: Payer: Self-pay

## 2018-12-01 DIAGNOSIS — Z20822 Contact with and (suspected) exposure to covid-19: Secondary | ICD-10-CM

## 2018-12-02 LAB — NOVEL CORONAVIRUS, NAA: SARS-CoV-2, NAA: NOT DETECTED

## 2018-12-12 ENCOUNTER — Other Ambulatory Visit: Payer: Self-pay | Admitting: Psychiatry

## 2018-12-23 ENCOUNTER — Encounter: Payer: Self-pay | Admitting: Psychiatry

## 2018-12-23 ENCOUNTER — Encounter

## 2018-12-23 ENCOUNTER — Other Ambulatory Visit: Payer: Self-pay

## 2018-12-23 ENCOUNTER — Ambulatory Visit: Payer: Self-pay | Admitting: Psychiatry

## 2018-12-23 ENCOUNTER — Ambulatory Visit (INDEPENDENT_AMBULATORY_CARE_PROVIDER_SITE_OTHER): Payer: Self-pay | Admitting: Psychiatry

## 2018-12-23 DIAGNOSIS — F330 Major depressive disorder, recurrent, mild: Secondary | ICD-10-CM

## 2018-12-23 DIAGNOSIS — F411 Generalized anxiety disorder: Secondary | ICD-10-CM

## 2018-12-23 DIAGNOSIS — F5105 Insomnia due to other mental disorder: Secondary | ICD-10-CM

## 2018-12-23 MED ORDER — SERTRALINE HCL 100 MG PO TABS: 200.0000 mg | ORAL_TABLET | Freq: Every day | ORAL | 1 refills | Status: DC

## 2018-12-23 MED ORDER — ALPRAZOLAM 0.5 MG PO TABS: 0.5000 mg | ORAL_TABLET | Freq: Every evening | ORAL | 5 refills | Status: DC | PRN

## 2018-12-23 MED ORDER — TRAZODONE HCL 100 MG PO TABS: 100.0000 mg | ORAL_TABLET | Freq: Every evening | ORAL | 1 refills | Status: DC | PRN

## 2018-12-23 NOTE — Progress Notes (Signed)
Rachel Bullock [id number] 05/21/1965 53 y.o.  Subjective:   Patient ID:  Rachel Bullock is a 53 y.o. (DOB Sep 19, 1965) female.  Chief Complaint:  Chief Complaint  Patient presents with  . Follow-up    Medication Management  . Anxiety    Medication Management  . Depression    Medication Management  . Medication Refill    Xanax    HPI Rachel Bullock presents to the office today for follow-up of major depression, generalized anxiety disorder, and a remote history of cocaine dependence.  Last seen January 14, 2018.  No meds or change  Father died couple mos ago.  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  Worry about her safety.  Not seen since march.  Out of work for 3 mos and isolated herself.  Taken Covid seriously.  B and sister in chemo.    Mood up and down with stressors.  Some days better than others but managing.  Dealing with estate stuff and anxious over it.  Older siblings handling it.  2 pets helped her during unemployment.  Back to work end of May.  Function well generally except GI problems.  More anxiety worsens IBS which worsens anxiety.  Diarrhea episodes worse since March.Tried alter diet.  Not seen a doctor bc no insurance.  IBS predated sertraline. Sertaline caused some weight.   Out of Xanax since March.  Sleep is up and down without Xanax which helps go to sleep and trazodone helps keep her asleep.  Work helps sleep.  Not drowsy unless poor sleep like last night EMA.    Misses [employer].  Past Psychiatric Medication Trials: Ambien, trazodone, hydroxyzine no response, Xanax 0.5 nightly Buspirone 30 twice daily, sertraline 200 mg  Review of Systems:  Review of Systems  Neurological: Negative for tremors and weakness.  Psychiatric/Behavioral: Positive for dysphoric mood and sleep disturbance. Negative for agitation, behavioral problems, confusion, decreased concentration, hallucinations, self-injury and suicidal ideas. The patient is  nervous/anxious. The patient is not hyperactive.     Medications: I have reviewed the patient's current medications.  Current Outpatient Medications  Medication Sig Dispense Refill  . busPIRone (BUSPAR) 30 MG tablet TAKE ONE TABLET BY MOUTH TWICE DAILY  60 tablet 0  . fexofenadine (ALLEGRA) 180 MG tablet Take 180 mg by mouth daily.    Marland Kitchen ibuprofen (ADVIL,MOTRIN) 200 MG tablet Take 800 mg by mouth every 6 (six) hours as needed for headache.    . sertraline (ZOLOFT) 100 MG tablet Take 2 tablets (200 mg total) by mouth daily. 180 tablet 1  . traZODone (DESYREL) 100 MG tablet Take 1-2 tablets (100-200 mg total) by mouth at bedtime as needed for sleep. 180 tablet 1  . ALPRAZolam (XANAX) 0.5 MG tablet Take 1 tablet (0.5 mg total) by mouth at bedtime as needed for anxiety or sleep (sleep or anxiety). 30 tablet 5   No current facility-administered medications for this visit.     Medication Side Effects: Other: original weight gain up and down 10#  Allergies:  Allergies  Allergen Reactions  . Molds & Smuts Shortness Of Breath and Nausea And Vomiting  . Nutritional Supplements Other (See Comments)    Doubles over in pain. But can take Celebrex  . Dust Mite Extract Itching  . Pollen Extract Other (See Comments)    Past Medical History:  Diagnosis Date  . Anemia   . Anxiety   . Arthritis    back and knee pain  .  Depression   . GERD (gastroesophageal reflux disease)   . SVD (spontaneous vaginal delivery)    x 1    History reviewed. No pertinent family history.  Social History   Socioeconomic History  . Marital status: Married    Spouse name: Not on file  . Number of children: Not on file  . Years of education: Not on file  . Highest education level: Not on file  Occupational History  . Not on file  Social Needs  . Financial resource strain: Not on file  . Food insecurity    Worry: Not on file    Inability: Not on file  . Transportation needs    Medical: Not on file     Non-medical: Not on file  Tobacco Use  . Smoking status: Former Smoker    Packs/day: 0.50    Years: 5.00    Pack years: 2.50    Types: Cigarettes    Quit date: 04/23/1990    Years since quitting: 28.6  . Smokeless tobacco: Never Used  Substance and Sexual Activity  . Alcohol use: Yes    Alcohol/week: 5.0 standard drinks    Types: 5 Shots of liquor per week  . Drug use: No  . Sexual activity: Yes    Birth control/protection: Condom  Lifestyle  . Physical activity    Days per week: Not on file    Minutes per session: Not on file  . Stress: Not on file  Relationships  . Social Herbalist on phone: Not on file    Gets together: Not on file    Attends religious service: Not on file    Active member of club or organization: Not on file    Attends meetings of clubs or organizations: Not on file    Relationship status: Not on file  . Intimate partner violence    Fear of current or ex partner: Not on file    Emotionally abused: Not on file    Physically abused: Not on file    Forced sexual activity: Not on file  Other Topics Concern  . Not on file  Social History Narrative  . Not on file    Past Medical History, Surgical history, Social history, and Family history were reviewed and updated as appropriate.   Please see review of systems for further details on the patient's review from today.   Objective:   Physical Exam:  There were no vitals taken for this visit.  Physical Exam Constitutional:      General: She is not in acute distress.    Appearance: She is well-developed.  Musculoskeletal:        General: No deformity.  Neurological:     Mental Status: She is alert and oriented to person, place, and time.     Coordination: Coordination normal.  Psychiatric:        Attention and Perception: Attention and perception normal. She does not perceive auditory or visual hallucinations.        Mood and Affect: Mood is anxious and depressed. Affect is not labile,  blunt, angry or inappropriate.        Speech: Speech normal.        Behavior: Behavior normal.        Thought Content: Thought content normal. Thought content is not paranoid or delusional. Thought content does not include homicidal or suicidal ideation. Thought content does not include homicidal or suicidal plan.        Cognition and  Memory: Cognition and memory normal.        Judgment: Judgment normal.     Comments: Insight intact Anxiety and depression are a little worse than usual due to multiple stressors but manageable.     Lab Review:     Component Value Date/Time   NA 139 07/13/2018 2202   K 3.7 07/13/2018 2202   CL 106 07/13/2018 2202   CO2 25 07/13/2018 2202   GLUCOSE 94 07/13/2018 2202   BUN 14 07/13/2018 2202   CREATININE 0.63 07/13/2018 2202   CALCIUM 8.6 (L) 07/13/2018 2202   PROT 7.0 10/03/2006 1218   ALBUMIN 3.9 10/03/2006 1218   AST 23 10/03/2006 1218   ALT 16 10/03/2006 1218   ALKPHOS 56 10/03/2006 1218   BILITOT 1.0 10/03/2006 1218   GFRNONAA >60 07/13/2018 2202   GFRAA >60 07/13/2018 2202       Component Value Date/Time   WBC 5.2 07/13/2018 2202   RBC 3.88 07/13/2018 2202   HGB 11.8 (L) 07/13/2018 2202   HCT 36.6 07/13/2018 2202   PLT 261 07/13/2018 2202   MCV 94.3 07/13/2018 2202   MCH 30.4 07/13/2018 2202   MCHC 32.2 07/13/2018 2202   RDW 11.7 07/13/2018 2202   LYMPHSABS 1.5 07/13/2018 2202   MONOABS 0.4 07/13/2018 2202   EOSABS 0.1 07/13/2018 2202   BASOSABS 0.0 07/13/2018 2202    No results found for: POCLITH, LITHIUM   No results found for: PHENYTOIN, PHENOBARB, VALPROATE, CBMZ   .res Assessment: Plan:    Kanishka was seen today for follow-up, anxiety, depression and medication refill.  Diagnoses and all orders for this visit:  Depression, major, recurrent, mild (HCC) -     sertraline (ZOLOFT) 100 MG tablet; Take 2 tablets (200 mg total) by mouth daily.  GAD (generalized anxiety disorder) -     Discontinue: ALPRAZolam (XANAX) 0.5  MG tablet; Take 1 tablet (0.5 mg total) by mouth at bedtime as needed for anxiety or sleep (sleep or anxiety). -     sertraline (ZOLOFT) 100 MG tablet; Take 2 tablets (200 mg total) by mouth daily. -     ALPRAZolam (XANAX) 0.5 MG tablet; Take 1 tablet (0.5 mg total) by mouth at bedtime as needed for anxiety or sleep (sleep or anxiety).  Insomnia due to mental condition -     Discontinue: ALPRAZolam (XANAX) 0.5 MG tablet; Take 1 tablet (0.5 mg total) by mouth at bedtime as needed for anxiety or sleep (sleep or anxiety). -     traZODone (DESYREL) 100 MG tablet; Take 1-2 tablets (100-200 mg total) by mouth at bedtime as needed for sleep. -     ALPRAZolam (XANAX) 0.5 MG tablet; Take 1 tablet (0.5 mg total) by mouth at bedtime as needed for anxiety or sleep (sleep or anxiety).  Her anxiety and depression are worse than baseline due to multiple stressors.  We discussed the pros and cons of switching to paroxetine to get better antianxiety control.  Discussed the process of cross tapering.  Discussed the side effects of paroxetine in detail.  Discussed that it might also help reduce her diarrhea although her IBS symptoms predated starting sertraline.  We discussed the risk that sertraline can sometimes cause colitis.  She was concerned about the potential weight gain with paroxetine and did not want to make the switch.  Max benefit of trazodone and if needed add Xanax back if necessary. Disc SE. We discussed the short-term risks associated with benzodiazepines including sedation and increased fall risk  among others.  Discussed long-term side effect risk including dependence, potential withdrawal symptoms, and the potential eventual dose-related risk of dementia.  Avoid using the Xanax if at all possible. Trial increase trazodone 100-200 mg HS. If fails xanax 0.5 1/2-1 tablet hs prn.  Continue sertraline 200 mg daily without change Continue buspirone 30 mg twice daily without change  Follow-up 6  months  Lynder Parents MD, DFAPA  Please see After Visit Summary for patient specific instructions.  No future appointments.  No orders of the defined types were placed in this encounter.   -------------------------------

## 2019-01-29 ENCOUNTER — Other Ambulatory Visit: Payer: Self-pay | Admitting: Psychiatry

## 2019-02-12 ENCOUNTER — Other Ambulatory Visit: Payer: Self-pay

## 2019-02-12 DIAGNOSIS — Z20822 Contact with and (suspected) exposure to covid-19: Secondary | ICD-10-CM

## 2019-02-14 LAB — NOVEL CORONAVIRUS, NAA: SARS-CoV-2, NAA: NOT DETECTED

## 2019-06-22 ENCOUNTER — Ambulatory Visit: Payer: Self-pay | Admitting: Psychiatry

## 2019-07-06 ENCOUNTER — Other Ambulatory Visit: Payer: Self-pay | Admitting: Psychiatry

## 2019-07-06 DIAGNOSIS — F411 Generalized anxiety disorder: Secondary | ICD-10-CM

## 2019-07-06 DIAGNOSIS — F330 Major depressive disorder, recurrent, mild: Secondary | ICD-10-CM

## 2019-10-26 ENCOUNTER — Other Ambulatory Visit: Payer: Self-pay

## 2019-10-26 ENCOUNTER — Emergency Department (HOSPITAL_COMMUNITY)
Admission: EM | Admit: 2019-10-26 | Discharge: 2019-10-27 | Discharge status: Against Medical Advice | Payer: 59 | Attending: Emergency Medicine | Admitting: Emergency Medicine

## 2019-10-26 ENCOUNTER — Encounter (HOSPITAL_COMMUNITY): Payer: Self-pay

## 2019-10-26 DIAGNOSIS — R45851 Suicidal ideations: Secondary | ICD-10-CM | POA: Diagnosis not present

## 2019-10-26 DIAGNOSIS — F410 Panic disorder [episodic paroxysmal anxiety] without agoraphobia: Secondary | ICD-10-CM

## 2019-10-26 DIAGNOSIS — F329 Major depressive disorder, single episode, unspecified: Secondary | ICD-10-CM | POA: Diagnosis not present

## 2019-10-26 DIAGNOSIS — Z87891 Personal history of nicotine dependence: Secondary | ICD-10-CM | POA: Insufficient documentation

## 2019-10-26 DIAGNOSIS — F419 Anxiety disorder, unspecified: Secondary | ICD-10-CM | POA: Insufficient documentation

## 2019-10-26 LAB — CBC
HCT: 42.7 % (ref 36.0–46.0)
Hemoglobin: 14.6 g/dL (ref 12.0–15.0)
MCH: 31.8 pg (ref 26.0–34.0)
MCHC: 34.2 g/dL (ref 30.0–36.0)
MCV: 93 fL (ref 80.0–100.0)
Platelets: 216 10*3/uL (ref 150–400)
RBC: 4.59 MIL/uL (ref 3.87–5.11)
RDW: 13 % (ref 11.5–15.5)
WBC: 4.8 10*3/uL (ref 4.0–10.5)
nRBC: 0 % (ref 0.0–0.2)

## 2019-10-26 LAB — COMPREHENSIVE METABOLIC PANEL
ALT: 38 U/L (ref 0–44)
AST: 40 U/L (ref 15–41)
Albumin: 5.1 g/dL — ABNORMAL HIGH (ref 3.5–5.0)
Alkaline Phosphatase: 72 U/L (ref 38–126)
Anion gap: 12 (ref 5–15)
BUN: 19 mg/dL (ref 6–20)
CO2: 26 mmol/L (ref 22–32)
Calcium: 9.9 mg/dL (ref 8.9–10.3)
Chloride: 102 mmol/L (ref 98–111)
Creatinine, Ser: 0.66 mg/dL (ref 0.44–1.00)
GFR calc Af Amer: >60 mL/min (ref >60)
GFR calc non Af Amer: >60 mL/min (ref >60)
Glucose, Bld: 92 mg/dL (ref 70–99)
Potassium: 4.1 mmol/L (ref 3.5–5.1)
Sodium: 140 mmol/L (ref 135–145)
Total Bilirubin: 0.6 mg/dL (ref 0.3–1.2)
Total Protein: 8.2 g/dL — ABNORMAL HIGH (ref 6.5–8.1)

## 2019-10-26 LAB — ACETAMINOPHEN LEVEL: Acetaminophen (Tylenol), Serum: <10 ug/mL — ABNORMAL LOW (ref 10–30)

## 2019-10-26 LAB — SALICYLATE LEVEL: Salicylate Lvl: <7 mg/dL — ABNORMAL LOW (ref 7.0–30.0)

## 2019-10-26 LAB — I-STAT BETA HCG BLOOD, ED (MC, WL, AP ONLY): I-stat hCG, quantitative: <5 m[IU]/mL (ref <5)

## 2019-10-26 LAB — ETHANOL: Alcohol, Ethyl (B): 172 mg/dL — ABNORMAL HIGH (ref <10)
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
## 2019-10-27 ENCOUNTER — Other Ambulatory Visit: Payer: Self-pay | Admitting: Psychiatry

## 2019-10-27 ENCOUNTER — Telehealth: Payer: Self-pay | Admitting: Psychiatry

## 2019-10-27 NOTE — Telephone Encounter (Signed)
I assume she knows to use the xanax prn panic.  Let me know if there is anything else needed.

## 2019-10-27 NOTE — Telephone Encounter (Signed)
Rachel Bullock called because she had a terrible panic attack last night.  Went to [facility] and they put her in a room and left her alone with no one seeing her for 5 hours.  So she left.  Went home and took her medicine and was able to go to sleep.  Is stable now but wanted to see Dr. Clovis Pu.  Was able to schedule her for 11/19/19 and put on the wait list. She doesn't really need anything right now since she has stabilized but wanted it noted that she needs to be seen due to the panic attack.

## 2019-10-27 NOTE — ED Notes (Signed)
Pt eloped from treatment room saying she was going home. Pt educated that based on complaint she needed to be cleared by the provider. Pt then ran out of department. Attempted to locate patient with no success.

## 2019-10-27 NOTE — ED Notes (Signed)
[facility] communications contacted regarding assistance locating patient after IVC papers obtained.

## 2019-10-29 ENCOUNTER — Other Ambulatory Visit: Payer: Self-pay | Admitting: Psychiatry

## 2019-10-29 DIAGNOSIS — F411 Generalized anxiety disorder: Secondary | ICD-10-CM

## 2019-10-29 DIAGNOSIS — F330 Major depressive disorder, recurrent, mild: Secondary | ICD-10-CM

## 2019-11-19 ENCOUNTER — Ambulatory Visit: Payer: Self-pay | Admitting: Psychiatry

## 2019-12-08 ENCOUNTER — Other Ambulatory Visit: Payer: Self-pay | Admitting: Psychiatry

## 2019-12-08 DIAGNOSIS — F330 Major depressive disorder, recurrent, mild: Secondary | ICD-10-CM

## 2019-12-08 DIAGNOSIS — F411 Generalized anxiety disorder: Secondary | ICD-10-CM

## 2019-12-11 NOTE — Telephone Encounter (Signed)
Patient needs apt. Last visit last year and was in ER in July for SI

## 2019-12-11 NOTE — Telephone Encounter (Signed)
Pt needs appt

## 2019-12-14 NOTE — Telephone Encounter (Signed)
LM for Rachel Bullock to call to make appt to get further refills on her medications.

## 2020-03-05 ENCOUNTER — Other Ambulatory Visit: Payer: Self-pay | Admitting: Psychiatry

## 2020-03-05 DIAGNOSIS — F330 Major depressive disorder, recurrent, mild: Secondary | ICD-10-CM

## 2020-03-05 DIAGNOSIS — F411 Generalized anxiety disorder: Secondary | ICD-10-CM

## 2020-03-14 ENCOUNTER — Other Ambulatory Visit: Payer: Self-pay

## 2020-03-14 ENCOUNTER — Ambulatory Visit (INDEPENDENT_AMBULATORY_CARE_PROVIDER_SITE_OTHER): Payer: Self-pay | Admitting: Psychiatry

## 2020-03-14 ENCOUNTER — Encounter: Payer: Self-pay | Admitting: Psychiatry

## 2020-03-14 DIAGNOSIS — F330 Major depressive disorder, recurrent, mild: Secondary | ICD-10-CM

## 2020-03-14 DIAGNOSIS — F411 Generalized anxiety disorder: Secondary | ICD-10-CM

## 2020-03-14 DIAGNOSIS — Z8659 Personal history of other mental and behavioral disorders: Secondary | ICD-10-CM

## 2020-03-14 DIAGNOSIS — F5105 Insomnia due to other mental disorder: Secondary | ICD-10-CM

## 2020-03-14 MED ORDER — ALPRAZOLAM 0.5 MG PO TABS: 0.5000 mg | ORAL_TABLET | Freq: Every evening | ORAL | 5 refills | Status: DC | PRN

## 2020-03-14 MED ORDER — TRAZODONE HCL 100 MG PO TABS: 100.0000 mg | ORAL_TABLET | Freq: Every evening | ORAL | 1 refills | Status: DC | PRN

## 2020-03-14 MED ORDER — SERTRALINE HCL 100 MG PO TABS: 200.0000 mg | ORAL_TABLET | Freq: Every day | ORAL | 5 refills | Status: DC
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
## 2020-04-02 ENCOUNTER — Emergency Department (HOSPITAL_COMMUNITY): Admission: EM | Admit: 2020-04-02 | Discharge: 2020-04-02 | Discharge status: Against Medical Advice | Payer: 59

## 2020-06-05 IMAGING — CR CHEST - 2 VIEW
2 series · 2 of 2 positions shown · non-contrast
Comparison: None.

CLINICAL DATA: Shortness of breath.

EXAM:
CHEST - 2 VIEW

[w chest pa]
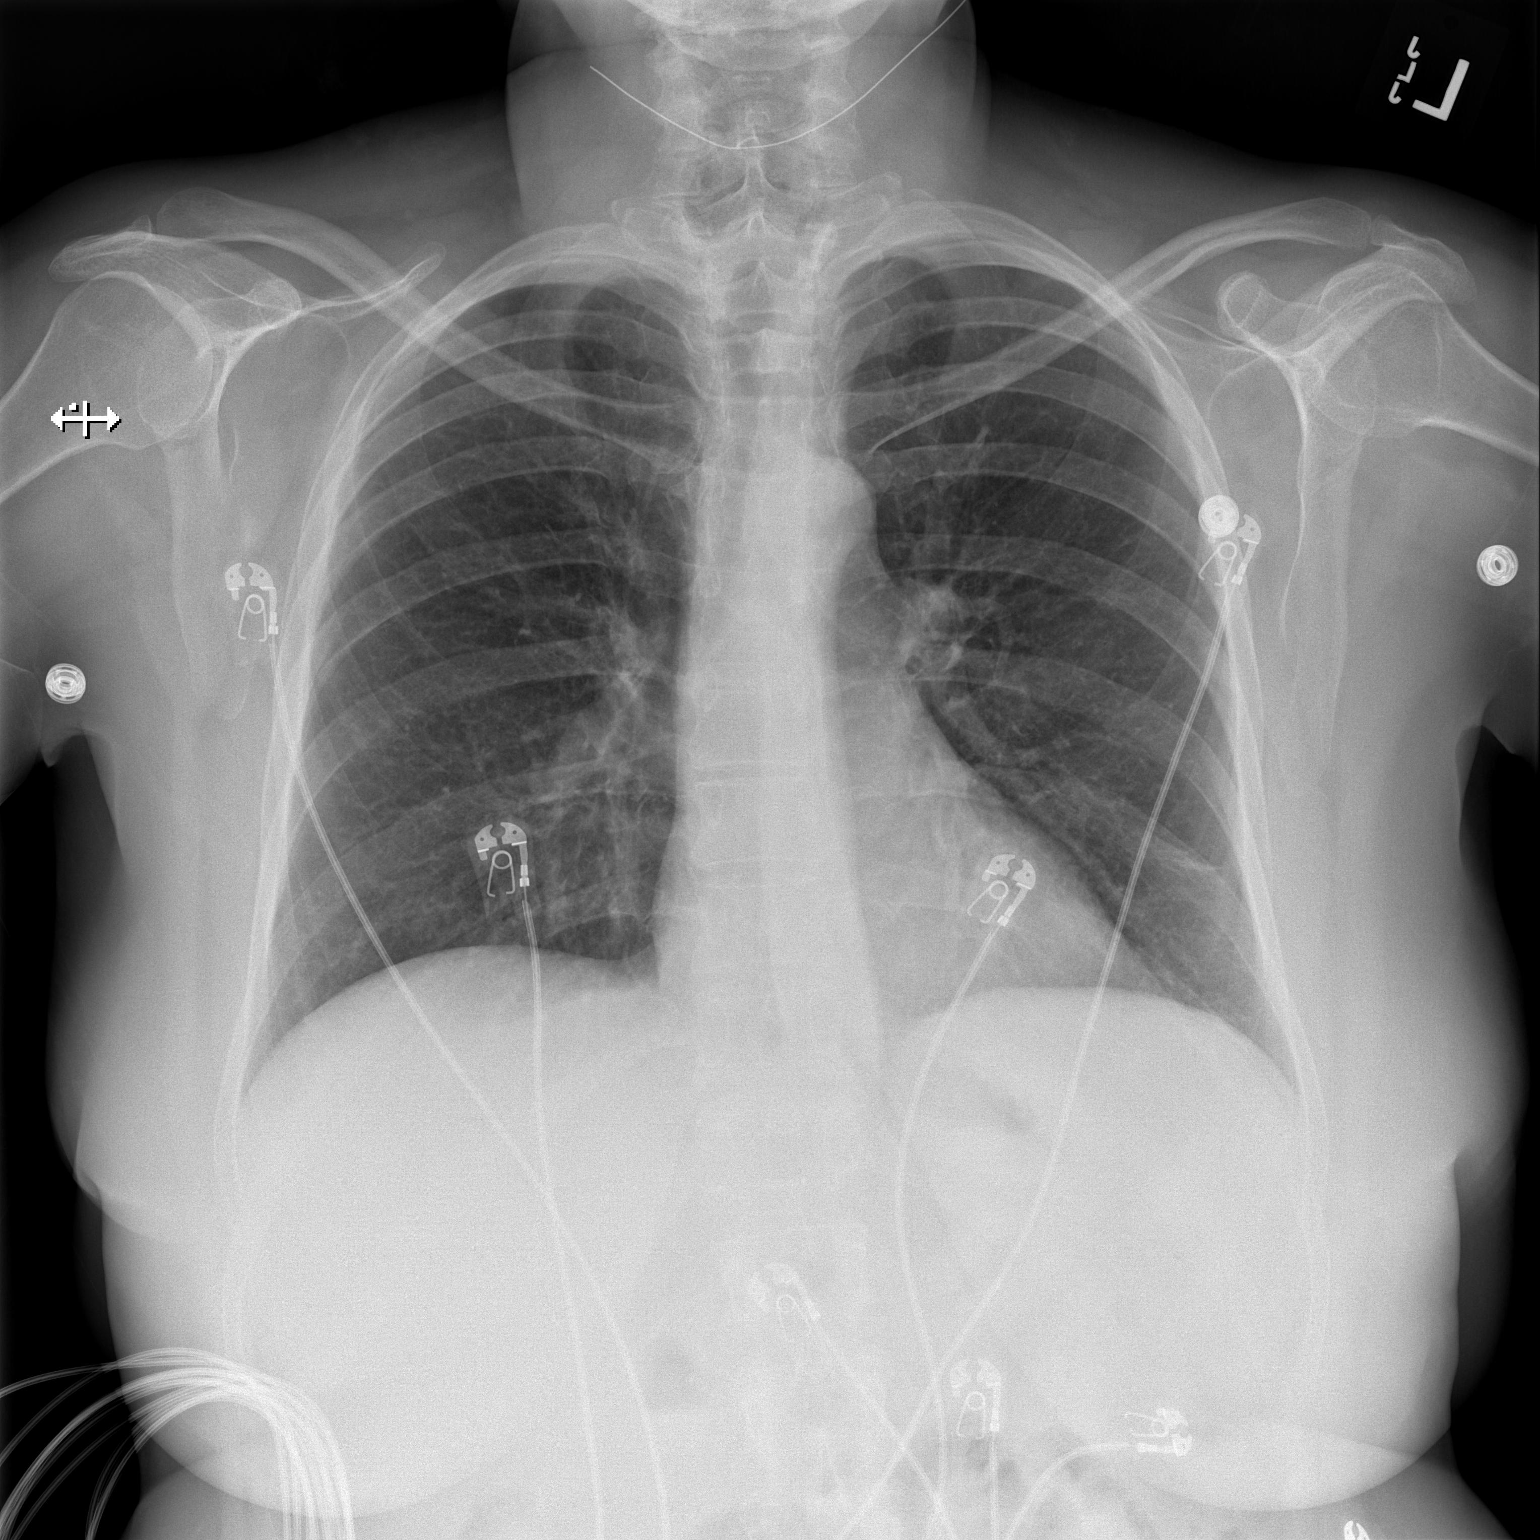

[w chest lat]
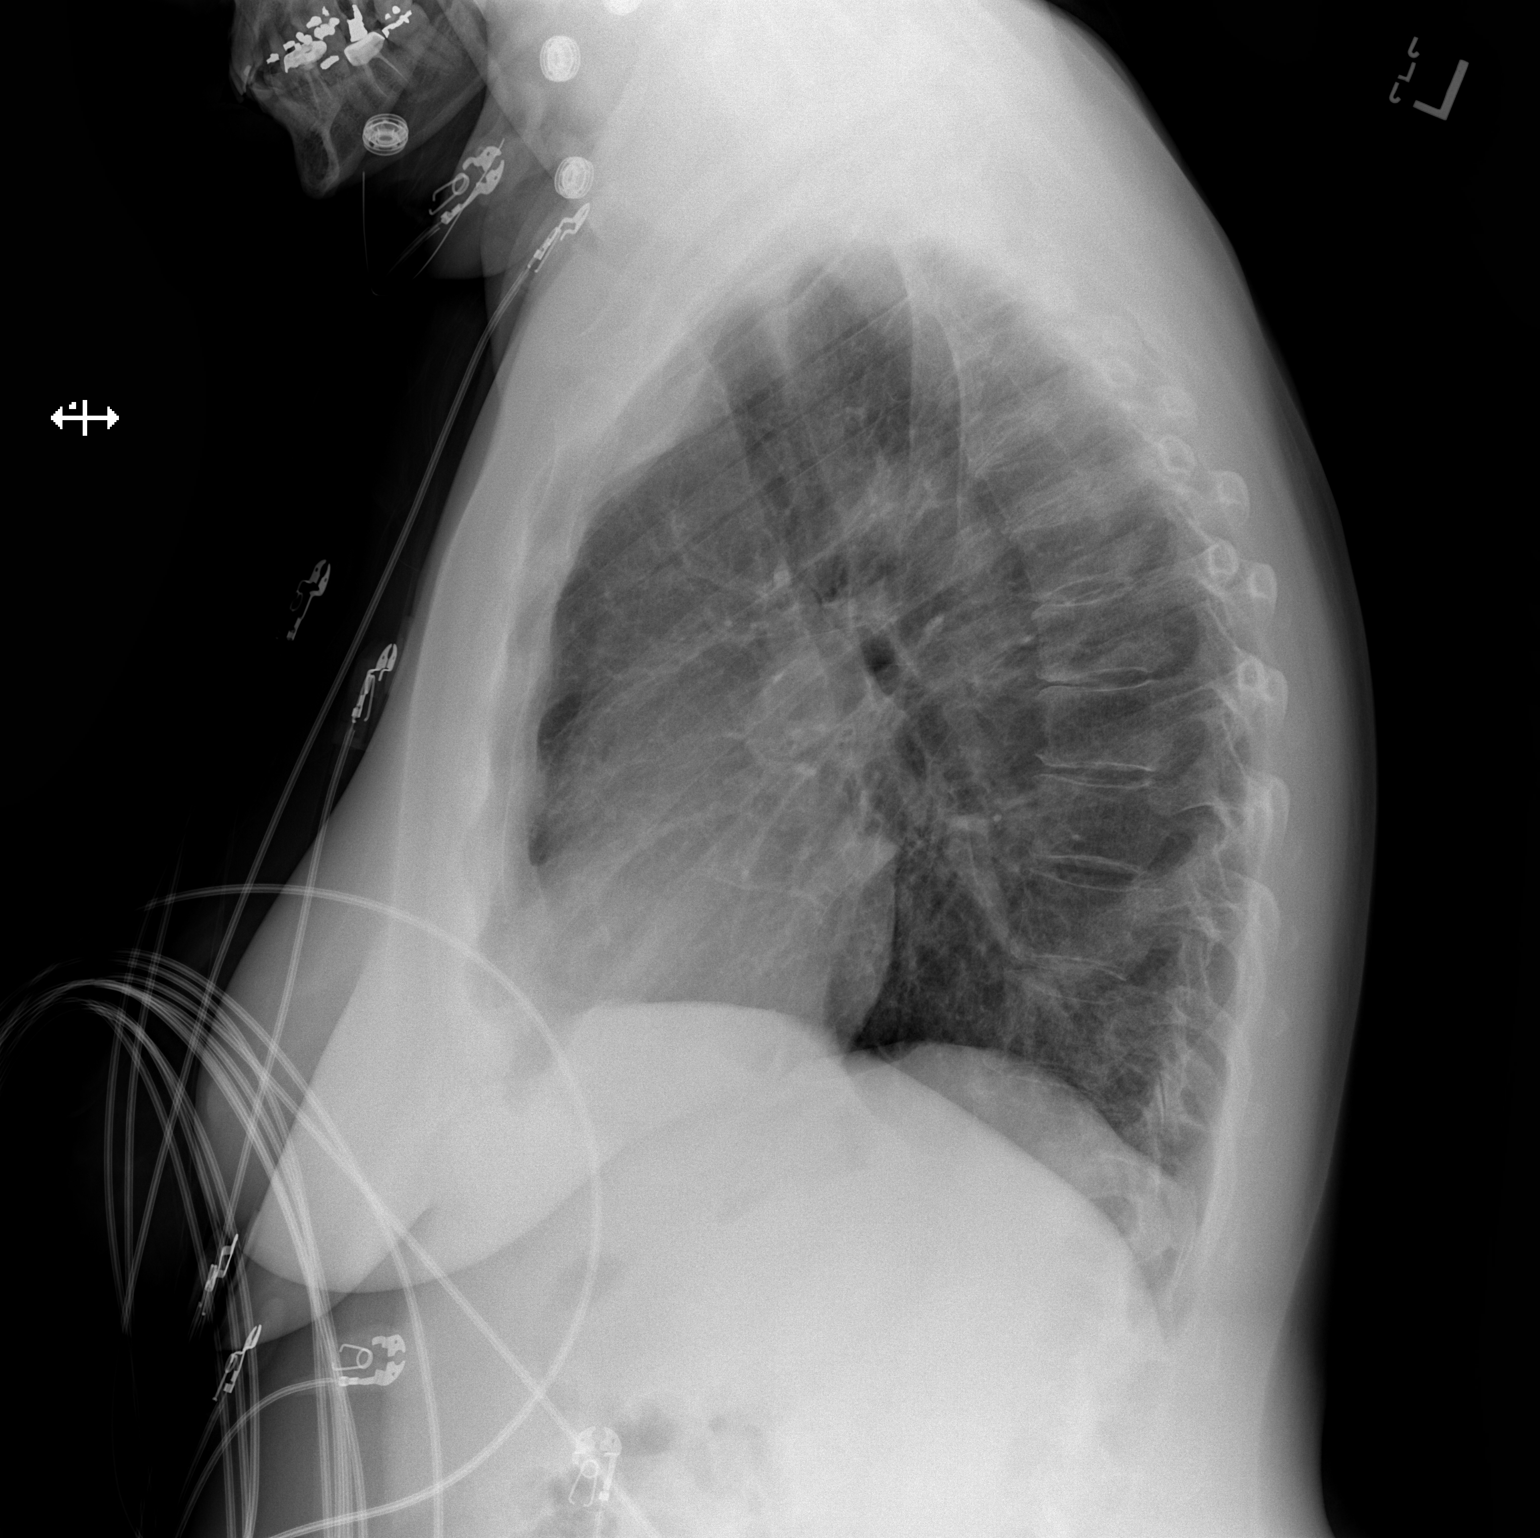

[2 of 2 positions shown; findings below may reference images not displayed]

FINDINGS: The cardiomediastinal contours are normal. Subsegmental atelectasis
at the left lung base. Pulmonary vasculature is normal. No
consolidation, pleural effusion, or pneumothorax. No acute osseous
abnormalities are seen.
IMPRESSION: Subsegmental left basilar atelectasis.  Otherwise clear lungs.

## 2020-09-26 ENCOUNTER — Other Ambulatory Visit: Payer: Self-pay | Admitting: Psychiatry

## 2020-09-26 DIAGNOSIS — F5105 Insomnia due to other mental disorder: Secondary | ICD-10-CM

## 2020-09-26 DIAGNOSIS — F411 Generalized anxiety disorder: Secondary | ICD-10-CM

## 2020-09-27 NOTE — Telephone Encounter (Signed)
Last filled 08/26/20 appt on 03/14/21

## 2020-10-22 ENCOUNTER — Other Ambulatory Visit: Payer: Self-pay | Admitting: Psychiatry

## 2020-10-22 DIAGNOSIS — F411 Generalized anxiety disorder: Secondary | ICD-10-CM

## 2020-10-22 DIAGNOSIS — F330 Major depressive disorder, recurrent, mild: Secondary | ICD-10-CM

## 2020-11-08 ENCOUNTER — Other Ambulatory Visit: Payer: Self-pay | Admitting: Psychiatry

## 2020-11-08 DIAGNOSIS — F5105 Insomnia due to other mental disorder: Secondary | ICD-10-CM

## 2020-11-22 ENCOUNTER — Other Ambulatory Visit: Payer: Self-pay | Admitting: Psychiatry

## 2020-11-22 DIAGNOSIS — F411 Generalized anxiety disorder: Secondary | ICD-10-CM

## 2020-11-22 DIAGNOSIS — F330 Major depressive disorder, recurrent, mild: Secondary | ICD-10-CM

## 2021-01-22 ENCOUNTER — Other Ambulatory Visit: Payer: Self-pay | Admitting: Psychiatry

## 2021-01-22 DIAGNOSIS — F411 Generalized anxiety disorder: Secondary | ICD-10-CM

## 2021-01-22 DIAGNOSIS — F330 Major depressive disorder, recurrent, mild: Secondary | ICD-10-CM

## 2021-02-10 ENCOUNTER — Other Ambulatory Visit: Payer: Self-pay | Admitting: Psychiatry

## 2021-02-10 DIAGNOSIS — F5105 Insomnia due to other mental disorder: Secondary | ICD-10-CM

## 2021-03-14 ENCOUNTER — Ambulatory Visit: Payer: Self-pay | Admitting: Psychiatry

## 2021-04-03 ENCOUNTER — Other Ambulatory Visit: Payer: Self-pay | Admitting: Psychiatry

## 2021-04-03 DIAGNOSIS — F411 Generalized anxiety disorder: Secondary | ICD-10-CM

## 2021-04-03 DIAGNOSIS — F330 Major depressive disorder, recurrent, mild: Secondary | ICD-10-CM

## 2021-04-03 NOTE — Telephone Encounter (Signed)
Last seen 03/14/20

## 2021-04-03 NOTE — Telephone Encounter (Signed)
Please schedule appt

## 2021-04-04 NOTE — Telephone Encounter (Signed)
Left message for pt to schedule

## 2021-05-08 ENCOUNTER — Other Ambulatory Visit: Payer: Self-pay | Admitting: Psychiatry

## 2021-05-08 DIAGNOSIS — F330 Major depressive disorder, recurrent, mild: Secondary | ICD-10-CM

## 2021-05-08 DIAGNOSIS — F411 Generalized anxiety disorder: Secondary | ICD-10-CM

## 2021-05-10 NOTE — Telephone Encounter (Signed)
Please contact patient to schedule an appt. She was last seen 02/2020. She did have an appt scheduled but it was cancelled by provider.

## 2021-05-10 NOTE — Telephone Encounter (Signed)
Pt has an appt 1/26

## 2021-05-18 ENCOUNTER — Ambulatory Visit (INDEPENDENT_AMBULATORY_CARE_PROVIDER_SITE_OTHER): Payer: 59 | Admitting: Psychiatry

## 2021-05-18 ENCOUNTER — Encounter: Payer: Self-pay | Admitting: Psychiatry

## 2021-05-18 ENCOUNTER — Other Ambulatory Visit: Payer: Self-pay

## 2021-05-18 DIAGNOSIS — F5105 Insomnia due to other mental disorder: Secondary | ICD-10-CM

## 2021-05-18 DIAGNOSIS — F411 Generalized anxiety disorder: Secondary | ICD-10-CM

## 2021-05-18 DIAGNOSIS — F330 Major depressive disorder, recurrent, mild: Secondary | ICD-10-CM | POA: Diagnosis not present

## 2021-05-18 MED ORDER — TRAZODONE HCL 100 MG PO TABS: 100.0000 mg | ORAL_TABLET | Freq: Every evening | ORAL | 3 refills | Status: DC | PRN

## 2021-05-18 MED ORDER — ALPRAZOLAM 0.5 MG PO TABS: 0.5000 mg | ORAL_TABLET | Freq: Two times a day (BID) | ORAL | 3 refills | Status: DC | PRN

## 2021-05-18 MED ORDER — SERTRALINE HCL 100 MG PO TABS: 200.0000 mg | ORAL_TABLET | Freq: Every day | ORAL | 3 refills | Status: DC

## 2021-05-18 NOTE — Patient Instructions (Signed)
[place]
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
## 2021-08-29 ENCOUNTER — Encounter: Payer: Self-pay | Admitting: Nurse Practitioner

## 2021-09-20 ENCOUNTER — Ambulatory Visit: Payer: Self-pay | Admitting: Nurse Practitioner

## 2021-10-05 ENCOUNTER — Other Ambulatory Visit: Payer: Self-pay

## 2021-10-11 ENCOUNTER — Encounter: Payer: Self-pay | Admitting: Nurse Practitioner

## 2021-10-11 ENCOUNTER — Ambulatory Visit: Payer: Commercial Managed Care - HMO | Admitting: Nurse Practitioner

## 2021-10-11 VITALS — BP 120/84 | HR 72 | Ht 62.75 in | Wt 156.0 lb

## 2021-10-11 DIAGNOSIS — K529 Noninfective gastroenteritis and colitis, unspecified: Secondary | ICD-10-CM

## 2021-10-11 DIAGNOSIS — K219 Gastro-esophageal reflux disease without esophagitis: Secondary | ICD-10-CM

## 2021-10-11 MED ORDER — NA SULFATE-K SULFATE-MG SULF 17.5-3.13-1.6 GM/177ML PO SOLN: 1.0000 | Freq: Once | ORAL | 0 refills | Status: AC

## 2021-10-11 NOTE — Progress Notes (Signed)
Chief Complaint:  IBS   Assessment & Plan   56 yo female with remote history of cervical cancer, IBS, anxiety, depression, GERD.   # Chronic diarrhea  ( 20+years). She gives a history of IBS. Agree that diarrhea is probably functional however other causes such as IBD, microscopic colitis, EPI should be excluded. She says that previous celiac studies were negative.  Schedule for a diagnostic colonoscopy. The risks and benefits of colonoscopy with possible polypectomy / biopsies were discussed and the patient agrees to proceed.   # GERD, asymptomatic on daily Pepcid.      HPI:    Rachel Bullock is new to the practice, self referred for IBS. She tells me she was diagnosed with IBS 20 years ago. She has never had a colonoscopy. She has a [facility] of breast cancer in sister, throat cancer ( HPV ) in 2 brothers. No [place] of colon cancer.   Rachel Bullock has chronic diarrhea. ( 20 years)  with mil lower abdominal cramping. She has tried to manage with diet and imodium over the years.. She can have up to 10 loose BMs a day usually after eating but sometimes during the night. The diarrhea occurs about 3 days out of the week. The remainder of days she may have a solid BM. Diarrhea is exacerbated by stress. It is also exacerbated by red meat / animal fats / caffeine so she has to watch intake of those things.  She reports negative celiac studies in the 1990's. No celiac disease in family. .She takes imodium and gas-x on a regular basis which help. Sometimes she has rectal bleeding and gives a history of hemorrhoids and anal fissures. No [place] of Crohn's disease.   [name] has chronic GERD with heartburn. Pepcid 1-2 times daily helps and works better for her than prilosec. No solid food dysphagia. She has noticed lately that carbonated beverages get stuck in her throat but regular fluid are okay.    Past Medical History:  Diagnosis Date   Anal fissure    Anemia    Anxiety    Arthritis    back and knee pain   Cervical  cancer (HCC)    Depression    GERD (gastroesophageal reflux disease)    HPV (human papilloma virus) infection    IBS (irritable bowel syndrome)    SVD (spontaneous vaginal delivery)    x 1   Past Surgical History:  Procedure Laterality Date   ENDOMETRIAL ABLATION  04/23/2010   EXCISION MORTON'S NEUROMA Right    x 2   HEEL SPUR EXCISION Right    x 2   INCONTINENCE SURGERY     INGUINAL HERNIA REPAIR Right    RHINOPLASTY     TAYLOR BUNIONECTOMY Right    WISDOM TOOTH EXTRACTION     Family History  Problem Relation Age of Onset   Hypertension Mother    Atrial fibrillation Mother    Diabetes Father    Kidney disease Father    Irritable bowel syndrome Father    Breast cancer Sister    Throat cancer Brother        HPV   Throat cancer Brother        HPV   Lung cancer Brother    Stroke Maternal Grandmother    Hypertension Maternal Grandmother    Hyperlipidemia Maternal Grandmother    Diabetes Paternal Grandmother    Congestive Heart Failure Paternal Grandmother    Diabetes Paternal Grandfather    Congestive Heart Failure Paternal Grandfather  Social History   Tobacco Use   Smoking status: Former    Packs/day: 0.50    Years: 5.00    Total pack years: 2.50    Types: Cigarettes    Quit date: 04/23/1990    Years since quitting: 31.4   Smokeless tobacco: Never  Vaping Use   Vaping Use: Never used  Substance Use Topics   Alcohol use: Yes    Alcohol/week: 5.0 standard drinks of alcohol    Types: 5 Shots of liquor per week    Comment: 3 per day   Drug use: No   Current Outpatient Medications  Medication Sig Dispense Refill   ALPRAZolam (XANAX) 0.5 MG tablet Take 1 tablet (0.5 mg total) by mouth 2 (two) times daily as needed for anxiety. 30 tablet 3   fexofenadine (ALLEGRA) 180 MG tablet Take 180 mg by mouth daily.     Fexofenadine-Pseudoephedrine (ALLEGRA-D 24 HOUR PO) Allegra-D 24 Hour  One tablet by oral route once daily     fluticasone (FLONASE ALLERGY RELIEF) 50  MCG/ACT nasal spray Flonase Allergy Relief 50 mcg/actuation nasal spray,suspension  Spray 1 spray every day by intranasal route.     ibuprofen (ADVIL,MOTRIN) 200 MG tablet Take 800 mg by mouth every 6 (six) hours as needed for headache.     sertraline (ZOLOFT) 100 MG tablet Take 2 tablets (200 mg total) by mouth daily. 180 tablet 3   traZODone (DESYREL) 100 MG tablet Take 1 tablet (100 mg total) by mouth at bedtime as needed. (Patient taking differently: Take 100-150 mg by mouth at bedtime as needed.) 90 tablet 3   No current facility-administered medications for this visit.   Allergies  Allergen Reactions   Dust Mite Extract Itching and Other (See Comments)   Molds & Smuts Shortness Of Breath, Nausea And Vomiting and Other (See Comments)   Other Other (See Comments)   Nutritional Supplements Other (See Comments)    Doubles over in pain. But can take Celebrex   Pollen Extract Other (See Comments) and Rash    Review of Systems: Positive for allergy / sinus problems, back pain, cough, itching, muscle pain, sore throat, urine leakage. All other systems reviewed and negative except where noted in HPI.   Wt Readings from Last 3 Encounters:  10/11/21 156 lb (70.8 kg)  10/26/19 155 lb (70.3 kg)  07/13/18 155 lb (70.3 kg)    Physical Exam   BP 120/84 (BP Location: Left Arm, Patient Position: Sitting, Cuff Size: Normal)   Pulse 72   Ht 5' 2.75" (1.594 m) Comment: height measured without shoes  Wt 156 lb (70.8 kg)   BMI 27.85 kg/m  Constitutional:  Generally well appearing female in no acute distress. Psychiatric: Pleasant. Normal mood and affect. Behavior is normal. EENT: Pupils normal.  Conjunctivae are normal. No scleral icterus. Neck supple.  Cardiovascular: Normal rate, regular rhythm. No edema Pulmonary/chest: Effort normal and breath sounds normal. No wheezing, rales or rhonchi. Abdominal: Soft, nondistended, nontender. Bowel sounds active throughout. There are no masses  palpable. No hepatomegaly. Neurological: Alert and oriented to person place and time. Skin: Skin is warm and dry. No rashes noted.  Tye Savoy, NP  10/11/2021, 2:55 PM

## 2021-10-11 NOTE — Patient Instructions (Signed)
If you are age 57 or older, your body mass index should be between 23-30. Your Body mass index is 27.85 kg/m. If this is out of the aforementioned range listed, please consider follow up with your Primary Care Provider.  If you are age 27 or younger, your body mass index should be between 19-25. Your Body mass index is 27.85 kg/m. If this is out of the aformentioned range listed, please consider follow up with your Primary Care Provider.   ________________________________________________________  The [facility] providers would like to encourage you to use [facility] to communicate with providers for non-urgent requests or questions.  Due to long hold times on the telephone, sending your provider a message by [facility] may be a faster and more efficient way to get a response.  Please allow 48 business hours for a response.  Please remember that this is for non-urgent requests.  _______________________________________________________  Dennis Bast have been scheduled for a colonoscopy. Please follow written instructions given to you at your visit today.  Please pick up your prep supplies at the pharmacy within the next 1-3 days. If you use inhalers (even only as needed), please bring them with you on the day of your procedure.  Due to recent changes in healthcare laws, you may see the results of your imaging and laboratory studies on MyChart before your provider has had a chance to review them.  We understand that in some cases there may be results that are confusing or concerning to you. Not all laboratory results come back in the same time frame and the provider may be waiting for multiple results in order to interpret others.  Please give [place] 48 hours in order for your provider to thoroughly review all the results before contacting the office for clarification of your results.   It was a pleasure to see you today!  Thank you for trusting me with your gastrointestinal care!

## 2021-10-12 ENCOUNTER — Other Ambulatory Visit: Payer: Self-pay | Admitting: Psychiatry

## 2021-10-12 DIAGNOSIS — F5105 Insomnia due to other mental disorder: Secondary | ICD-10-CM

## 2021-10-12 DIAGNOSIS — F411 Generalized anxiety disorder: Secondary | ICD-10-CM

## 2021-10-17 ENCOUNTER — Telehealth: Payer: Self-pay | Admitting: Gastroenterology

## 2021-10-17 ENCOUNTER — Encounter: Payer: Commercial Managed Care - HMO | Admitting: Gastroenterology

## 2021-10-17 NOTE — Telephone Encounter (Signed)
Called patient at 3:11. Phone rang one time went straight to VM- left message to have pt call my direct number back to let us know if she is coming in for her colonoscopy or if she needs to R/S.

## 2021-11-15 ENCOUNTER — Ambulatory Visit: Payer: 59 | Admitting: Psychiatry

## 2022-07-10 ENCOUNTER — Other Ambulatory Visit: Payer: Self-pay | Admitting: Psychiatry

## 2022-07-10 DIAGNOSIS — F411 Generalized anxiety disorder: Secondary | ICD-10-CM

## 2022-07-10 DIAGNOSIS — F330 Major depressive disorder, recurrent, mild: Secondary | ICD-10-CM

## 2022-08-20 ENCOUNTER — Ambulatory Visit (INDEPENDENT_AMBULATORY_CARE_PROVIDER_SITE_OTHER): Payer: Self-pay | Admitting: Psychiatry

## 2022-08-20 DIAGNOSIS — Z91199 Patient's noncompliance with other medical treatment and regimen due to unspecified reason: Secondary | ICD-10-CM

## 2022-08-20 NOTE — Progress Notes (Signed)
No show

## 2022-10-04 ENCOUNTER — Other Ambulatory Visit: Payer: Self-pay | Admitting: Psychiatry

## 2022-10-04 DIAGNOSIS — F411 Generalized anxiety disorder: Secondary | ICD-10-CM

## 2022-10-04 DIAGNOSIS — F330 Major depressive disorder, recurrent, mild: Secondary | ICD-10-CM

## 2022-10-11 ENCOUNTER — Telehealth: Payer: Self-pay | Admitting: Psychiatry

## 2022-10-11 DIAGNOSIS — F330 Major depressive disorder, recurrent, mild: Secondary | ICD-10-CM

## 2022-10-11 DIAGNOSIS — F411 Generalized anxiety disorder: Secondary | ICD-10-CM

## 2022-10-11 NOTE — Telephone Encounter (Signed)
Please call to schedule an appt. Has not been seen since 04/2021, was a no show last 2 visits.

## 2022-10-11 NOTE — Telephone Encounter (Signed)
Pt called at 2:19p requesting refill for Sertraline to   [facility] # [address], [place] - [address] [place] [address] Phone: [phone]  Fax: [phone]   No upcoming appt scheduled.

## 2022-10-12 NOTE — Telephone Encounter (Signed)
She scheduled appt, but earliest is 8/20.  She wants to know if you will send medicine in until appt.

## 2022-10-15 MED ORDER — SERTRALINE HCL 100 MG PO TABS: 200.0000 mg | ORAL_TABLET | Freq: Every day | ORAL | 0 refills | Status: DC

## 2022-10-15 NOTE — Telephone Encounter (Signed)
Sent 30 day supply

## 2022-11-12 ENCOUNTER — Other Ambulatory Visit: Payer: Self-pay | Admitting: Psychiatry

## 2022-11-12 DIAGNOSIS — F330 Major depressive disorder, recurrent, mild: Secondary | ICD-10-CM

## 2022-11-12 DIAGNOSIS — F411 Generalized anxiety disorder: Secondary | ICD-10-CM

## 2022-12-11 ENCOUNTER — Ambulatory Visit: Payer: Commercial Managed Care - HMO | Admitting: Psychiatry

## 2022-12-11 ENCOUNTER — Encounter: Payer: Self-pay | Admitting: Psychiatry

## 2022-12-11 DIAGNOSIS — F411 Generalized anxiety disorder: Secondary | ICD-10-CM | POA: Diagnosis not present

## 2022-12-11 DIAGNOSIS — F330 Major depressive disorder, recurrent, mild: Secondary | ICD-10-CM | POA: Diagnosis not present

## 2022-12-11 DIAGNOSIS — F5105 Insomnia due to other mental disorder: Secondary | ICD-10-CM | POA: Diagnosis not present

## 2022-12-11 MED ORDER — TRAZODONE HCL 100 MG PO TABS: ORAL_TABLET | ORAL | 3 refills | Status: DC

## 2022-12-11 MED ORDER — SERTRALINE HCL 100 MG PO TABS: 200.0000 mg | ORAL_TABLET | Freq: Every day | ORAL | 3 refills | Status: DC
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
## 2023-12-11 ENCOUNTER — Encounter: Payer: Self-pay | Admitting: Psychiatry

## 2023-12-11 ENCOUNTER — Ambulatory Visit (INDEPENDENT_AMBULATORY_CARE_PROVIDER_SITE_OTHER): Payer: Commercial Managed Care - HMO | Admitting: Psychiatry

## 2023-12-11 DIAGNOSIS — F5105 Insomnia due to other mental disorder: Secondary | ICD-10-CM

## 2023-12-11 DIAGNOSIS — F330 Major depressive disorder, recurrent, mild: Secondary | ICD-10-CM

## 2023-12-11 DIAGNOSIS — F411 Generalized anxiety disorder: Secondary | ICD-10-CM

## 2023-12-11 MED ORDER — ALPRAZOLAM 0.5 MG PO TABS: 0.5000 mg | ORAL_TABLET | Freq: Two times a day (BID) | ORAL | 1 refills | Status: DC | PRN

## 2023-12-11 MED ORDER — SERTRALINE HCL 100 MG PO TABS: 200.0000 mg | ORAL_TABLET | Freq: Every day | ORAL | 3 refills | Status: AC

## 2023-12-11 MED ORDER — TRAZODONE HCL 100 MG PO TABS: ORAL_TABLET | ORAL | 3 refills | Status: AC
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
## 2024-03-04 ENCOUNTER — Other Ambulatory Visit: Payer: Self-pay | Admitting: Psychiatry

## 2024-03-04 DIAGNOSIS — F5105 Insomnia due to other mental disorder: Secondary | ICD-10-CM

## 2024-03-04 DIAGNOSIS — F411 Generalized anxiety disorder: Secondary | ICD-10-CM

## 2024-12-14 ENCOUNTER — Ambulatory Visit (INDEPENDENT_AMBULATORY_CARE_PROVIDER_SITE_OTHER): Payer: Self-pay | Admitting: Psychiatry
